# Patient Record
Sex: Female | Born: 1955
Health system: Southern US, Community
[De-identification: ages and names within clinical notes are randomized; demographics above are authoritative.]

## PROBLEM LIST (undated history)

## (undated) DIAGNOSIS — K8020 Calculus of gallbladder without cholecystitis without obstruction: Secondary | ICD-10-CM

## (undated) DIAGNOSIS — Z86010 Personal history of colonic polyps: Secondary | ICD-10-CM

## (undated) DIAGNOSIS — J449 Chronic obstructive pulmonary disease, unspecified: Secondary | ICD-10-CM

## (undated) DIAGNOSIS — J189 Pneumonia, unspecified organism: Secondary | ICD-10-CM

## (undated) DIAGNOSIS — K7030 Alcoholic cirrhosis of liver without ascites: Secondary | ICD-10-CM

## (undated) DIAGNOSIS — J439 Emphysema, unspecified: Secondary | ICD-10-CM

## (undated) DIAGNOSIS — M1990 Unspecified osteoarthritis, unspecified site: Secondary | ICD-10-CM

## (undated) DIAGNOSIS — K219 Gastro-esophageal reflux disease without esophagitis: Secondary | ICD-10-CM

## (undated) DIAGNOSIS — K7682 Hepatic encephalopathy: Secondary | ICD-10-CM

## (undated) HISTORY — DX: Unspecified osteoarthritis, unspecified site: M19.90

## (undated) HISTORY — DX: Emphysema, unspecified: J43.9

## (undated) HISTORY — PX: CHOLECYSTECTOMY: SHX55

## (undated) HISTORY — DX: Calculus of gallbladder without cholecystitis without obstruction: K80.20

## (undated) HISTORY — DX: Chronic obstructive pulmonary disease, unspecified: J44.9

## (undated) HISTORY — DX: Pneumonia, unspecified organism: J18.9

## (undated) HISTORY — PX: TONSILLECTOMY: SUR1361

## (undated) HISTORY — DX: Gastro-esophageal reflux disease without esophagitis: K21.9

## (undated) HISTORY — DX: Personal history of colonic polyps: Z86.010

---

## 2002-11-15 ENCOUNTER — Emergency Department (HOSPITAL_COMMUNITY): Admission: AD | Admit: 2002-11-15 | Discharge: 2002-11-15 | Payer: Self-pay | Admitting: Family Medicine

## 2009-02-28 ENCOUNTER — Emergency Department (HOSPITAL_COMMUNITY): Admission: EM | Admit: 2009-02-28 | Discharge: 2009-02-28 | Payer: Self-pay | Admitting: Family Medicine

## 2009-08-22 ENCOUNTER — Emergency Department (HOSPITAL_COMMUNITY): Admission: EM | Admit: 2009-08-22 | Discharge: 2009-08-22 | Payer: Self-pay | Admitting: Emergency Medicine

## 2009-09-09 ENCOUNTER — Emergency Department (HOSPITAL_COMMUNITY): Admission: EM | Admit: 2009-09-09 | Discharge: 2009-09-09 | Payer: Self-pay | Admitting: Emergency Medicine

## 2009-10-05 ENCOUNTER — Ambulatory Visit (HOSPITAL_COMMUNITY): Admission: RE | Admit: 2009-10-05 | Discharge: 2009-10-05 | Payer: Self-pay | Admitting: Surgery

## 2009-12-31 ENCOUNTER — Emergency Department (HOSPITAL_COMMUNITY)
Admission: EM | Admit: 2009-12-31 | Discharge: 2009-12-31 | Payer: Self-pay | Source: Home / Self Care | Admitting: Emergency Medicine

## 2010-03-26 LAB — URINE CULTURE
Colony Count: 25000
Culture  Setup Time: 201112181950

## 2010-03-26 LAB — COMPREHENSIVE METABOLIC PANEL
ALT: 26 U/L (ref 0–35)
AST: 27 U/L (ref 0–37)
Albumin: 3.8 g/dL (ref 3.5–5.2)
Alkaline Phosphatase: 73 U/L (ref 39–117)
BUN: 8 mg/dL (ref 6–23)
CO2: 27 mEq/L (ref 19–32)
Calcium: 9.6 mg/dL (ref 8.4–10.5)
Chloride: 95 mEq/L — ABNORMAL LOW (ref 96–112)
Creatinine, Ser: 0.67 mg/dL (ref 0.4–1.2)
GFR calc Af Amer: >60 mL/min (ref >60)
GFR calc non Af Amer: >60 mL/min (ref >60)
Glucose, Bld: 133 mg/dL — ABNORMAL HIGH (ref 70–99)
Potassium: 3.1 mEq/L — ABNORMAL LOW (ref 3.5–5.1)
Sodium: 131 mEq/L — ABNORMAL LOW (ref 135–145)
Total Bilirubin: 0.7 mg/dL (ref 0.3–1.2)
Total Protein: 7 g/dL (ref 6.0–8.3)

## 2010-03-26 LAB — URINALYSIS, ROUTINE W REFLEX MICROSCOPIC
Glucose, UA: NEGATIVE mg/dL
Ketones, ur: 15 mg/dL — AB
Nitrite: NEGATIVE
Protein, ur: NEGATIVE mg/dL
Specific Gravity, Urine: 1.028 (ref 1.005–1.030)
Urobilinogen, UA: 1 mg/dL (ref 0.0–1.0)
pH: 6 (ref 5.0–8.0)

## 2010-03-26 LAB — URINE MICROSCOPIC-ADD ON

## 2010-03-26 LAB — CBC
HCT: 45.5 % (ref 36.0–46.0)
Hemoglobin: 15.9 g/dL — ABNORMAL HIGH (ref 12.0–15.0)
MCH: 31.9 pg (ref 26.0–34.0)
MCHC: 34.9 g/dL (ref 30.0–36.0)
MCV: 91.2 fL (ref 78.0–100.0)
Platelets: 294 10*3/uL (ref 150–400)
RBC: 4.99 MIL/uL (ref 3.87–5.11)
RDW: 13.1 % (ref 11.5–15.5)
WBC: 6.9 10*3/uL (ref 4.0–10.5)

## 2010-03-26 LAB — LIPASE, BLOOD: Lipase: 22 U/L (ref 11–59)

## 2010-03-26 LAB — DIFFERENTIAL
Basophils Absolute: 0 10*3/uL (ref 0.0–0.1)
Basophils Relative: 0 % (ref 0–1)
Eosinophils Absolute: 0 10*3/uL (ref 0.0–0.7)
Eosinophils Relative: 0 % (ref 0–5)
Lymphocytes Relative: 15 % (ref 12–46)
Lymphs Abs: 1 10*3/uL (ref 0.7–4.0)
Monocytes Absolute: 0.9 10*3/uL (ref 0.1–1.0)
Monocytes Relative: 13 % — ABNORMAL HIGH (ref 3–12)
Neutro Abs: 5 10*3/uL (ref 1.7–7.7)
Neutrophils Relative %: 72 % (ref 43–77)

## 2010-03-29 LAB — CBC
HCT: 46 % (ref 36.0–46.0)
Hemoglobin: 15.6 g/dL — ABNORMAL HIGH (ref 12.0–15.0)
MCH: 33 pg (ref 26.0–34.0)
MCHC: 33.9 g/dL (ref 30.0–36.0)
MCV: 97.3 fL (ref 78.0–100.0)
Platelets: 313 10*3/uL (ref 150–400)
RBC: 4.73 MIL/uL (ref 3.87–5.11)
RDW: 13.1 % (ref 11.5–15.5)
WBC: 5.5 10*3/uL (ref 4.0–10.5)

## 2010-03-29 LAB — COMPREHENSIVE METABOLIC PANEL
ALT: 51 U/L — ABNORMAL HIGH (ref 0–35)
AST: 74 U/L — ABNORMAL HIGH (ref 0–37)
Albumin: 3.9 g/dL (ref 3.5–5.2)
Alkaline Phosphatase: 87 U/L (ref 39–117)
BUN: 5 mg/dL — ABNORMAL LOW (ref 6–23)
CO2: 24 mEq/L (ref 19–32)
Calcium: 9.9 mg/dL (ref 8.4–10.5)
Chloride: 101 mEq/L (ref 96–112)
Creatinine, Ser: 0.84 mg/dL (ref 0.4–1.2)
GFR calc Af Amer: >60 mL/min (ref >60)
GFR calc non Af Amer: >60 mL/min (ref >60)
Glucose, Bld: 74 mg/dL (ref 70–99)
Potassium: 4.4 mEq/L (ref 3.5–5.1)
Sodium: 137 mEq/L (ref 135–145)
Total Bilirubin: 1.3 mg/dL — ABNORMAL HIGH (ref 0.3–1.2)
Total Protein: 7.1 g/dL (ref 6.0–8.3)

## 2010-03-29 LAB — DIFFERENTIAL
Basophils Absolute: 0 10*3/uL (ref 0.0–0.1)
Basophils Relative: 1 % (ref 0–1)
Eosinophils Absolute: 0.2 10*3/uL (ref 0.0–0.7)
Eosinophils Relative: 3 % (ref 0–5)
Lymphocytes Relative: 16 % (ref 12–46)
Lymphs Abs: 0.9 10*3/uL (ref 0.7–4.0)
Monocytes Absolute: 0.7 10*3/uL (ref 0.1–1.0)
Monocytes Relative: 13 % — ABNORMAL HIGH (ref 3–12)
Neutro Abs: 3.8 10*3/uL (ref 1.7–7.7)
Neutrophils Relative %: 68 % (ref 43–77)

## 2010-03-29 LAB — SURGICAL PCR SCREEN
MRSA, PCR: NEGATIVE
Staphylococcus aureus: NEGATIVE

## 2010-03-30 LAB — DIFFERENTIAL
Basophils Absolute: 0 10*3/uL (ref 0.0–0.1)
Basophils Absolute: 0.1 10*3/uL (ref 0.0–0.1)
Basophils Relative: 1 % (ref 0–1)
Basophils Relative: 1 % (ref 0–1)
Eosinophils Absolute: 0.2 10*3/uL (ref 0.0–0.7)
Eosinophils Absolute: 0.1 10*3/uL (ref 0.0–0.7)
Eosinophils Relative: 3 % (ref 0–5)
Eosinophils Relative: 3 % (ref 0–5)
Lymphocytes Relative: 14 % (ref 12–46)
Lymphocytes Relative: 14 % (ref 12–46)
Lymphs Abs: 0.8 10*3/uL (ref 0.7–4.0)
Monocytes Absolute: 0.6 10*3/uL (ref 0.1–1.0)
Monocytes Relative: 9 % (ref 3–12)
Neutro Abs: 4.6 10*3/uL (ref 1.7–7.7)
Neutrophils Relative %: 74 % (ref 43–77)

## 2010-03-30 LAB — URINE CULTURE
Colony Count: NO GROWTH
Culture  Setup Time: 201108092225
Culture: NO GROWTH

## 2010-03-30 LAB — URINALYSIS, ROUTINE W REFLEX MICROSCOPIC
Bilirubin Urine: NEGATIVE
Bilirubin Urine: NEGATIVE
Glucose, UA: NEGATIVE mg/dL
Glucose, UA: NEGATIVE mg/dL
Hgb urine dipstick: NEGATIVE
Hgb urine dipstick: NEGATIVE
Ketones, ur: 15 mg/dL — AB
Ketones, ur: NEGATIVE mg/dL
Nitrite: NEGATIVE
Nitrite: NEGATIVE
Protein, ur: NEGATIVE mg/dL
Protein, ur: NEGATIVE mg/dL
Specific Gravity, Urine: 1.013 (ref 1.005–1.030)
Specific Gravity, Urine: 1.015 (ref 1.005–1.030)
Urobilinogen, UA: 0.2 mg/dL (ref 0.0–1.0)
Urobilinogen, UA: 0.2 mg/dL (ref 0.0–1.0)
pH: 6 (ref 5.0–8.0)
pH: 6 (ref 5.0–8.0)

## 2010-03-30 LAB — CBC
HCT: 40.7 % (ref 36.0–46.0)
HCT: 41.1 % (ref 36.0–46.0)
Hemoglobin: 14 g/dL (ref 12.0–15.0)
Hemoglobin: 14 g/dL (ref 12.0–15.0)
MCH: 32.9 pg (ref 26.0–34.0)
MCH: 32.5 pg (ref 26.0–34.0)
MCHC: 34.4 g/dL (ref 30.0–36.0)
MCHC: 34.1 g/dL (ref 30.0–36.0)
MCV: 95.8 fL (ref 78.0–100.0)
MCV: 95.4 fL (ref 78.0–100.0)
Platelets: 374 10*3/uL (ref 150–400)
Platelets: 363 10*3/uL (ref 150–400)
RBC: 4.25 MIL/uL (ref 3.87–5.11)
RBC: 4.31 MIL/uL (ref 3.87–5.11)
RDW: 13.4 % (ref 11.5–15.5)
RDW: 13.2 % (ref 11.5–15.5)
WBC: 6.2 10*3/uL (ref 4.0–10.5)
WBC: 5.3 10*3/uL (ref 4.0–10.5)

## 2010-03-30 LAB — POCT I-STAT, CHEM 8
HCT: 45 % (ref 36.0–46.0)
Hemoglobin: 15.3 g/dL — ABNORMAL HIGH (ref 12.0–15.0)
Potassium: 4 mEq/L (ref 3.5–5.1)
Sodium: 134 mEq/L — ABNORMAL LOW (ref 135–145)
TCO2: 23 mmol/L (ref 0–100)

## 2010-03-30 LAB — COMPREHENSIVE METABOLIC PANEL
ALT: 21 U/L (ref 0–35)
AST: 32 U/L (ref 0–37)
Albumin: 3.5 g/dL (ref 3.5–5.2)
Alkaline Phosphatase: 71 U/L (ref 39–117)
BUN: 2 mg/dL — ABNORMAL LOW (ref 6–23)
CO2: 23 mEq/L (ref 19–32)
Calcium: 9.1 mg/dL (ref 8.4–10.5)
Chloride: 105 mEq/L (ref 96–112)
Creatinine, Ser: 0.64 mg/dL (ref 0.4–1.2)
GFR calc Af Amer: >60 mL/min (ref >60)
GFR calc non Af Amer: >60 mL/min (ref >60)
Glucose, Bld: 103 mg/dL — ABNORMAL HIGH (ref 70–99)
Potassium: 3.6 mEq/L (ref 3.5–5.1)
Sodium: 135 mEq/L (ref 135–145)
Total Bilirubin: 0.1 mg/dL — ABNORMAL LOW (ref 0.3–1.2)
Total Protein: 6.7 g/dL (ref 6.0–8.3)

## 2010-03-30 LAB — HEPATIC FUNCTION PANEL
ALT: 27 U/L (ref 0–35)
Indirect Bilirubin: 0.2 mg/dL — ABNORMAL LOW (ref 0.3–0.9)
Total Protein: 6.7 g/dL (ref 6.0–8.3)

## 2010-03-30 LAB — URINE MICROSCOPIC-ADD ON

## 2010-03-30 LAB — D-DIMER, QUANTITATIVE: D-Dimer, Quant: 0.72 ug/mL-FEU — ABNORMAL HIGH (ref 0.00–0.48)

## 2010-03-30 LAB — POCT CARDIAC MARKERS
CKMB, poc: <1 ng/mL — ABNORMAL LOW (ref 1.0–8.0)
Myoglobin, poc: 36.4 ng/mL (ref 12–200)

## 2010-03-30 LAB — WET PREP, GENITAL: Clue Cells Wet Prep HPF POC: NONE SEEN

## 2010-03-30 LAB — LIPASE, BLOOD
Lipase: 22 U/L (ref 11–59)
Lipase: 24 U/L (ref 11–59)

## 2010-06-06 ENCOUNTER — Emergency Department (HOSPITAL_COMMUNITY): Payer: Self-pay

## 2010-06-06 ENCOUNTER — Emergency Department (HOSPITAL_COMMUNITY)
Admission: EM | Admit: 2010-06-06 | Discharge: 2010-06-07 | Disposition: A | Discharge status: Home / Self Care | Payer: Self-pay | Attending: Emergency Medicine | Admitting: Emergency Medicine

## 2010-06-06 DIAGNOSIS — M549 Dorsalgia, unspecified: Secondary | ICD-10-CM | POA: Insufficient documentation

## 2010-06-06 DIAGNOSIS — R1011 Right upper quadrant pain: Secondary | ICD-10-CM | POA: Insufficient documentation

## 2010-06-06 DIAGNOSIS — R112 Nausea with vomiting, unspecified: Secondary | ICD-10-CM | POA: Insufficient documentation

## 2010-06-06 DIAGNOSIS — G8929 Other chronic pain: Secondary | ICD-10-CM | POA: Insufficient documentation

## 2010-06-06 LAB — CBC
Hemoglobin: 14.2 g/dL (ref 12.0–15.0)
MCH: 31.5 pg (ref 26.0–34.0)
MCHC: 34.9 g/dL (ref 30.0–36.0)
Platelets: 315 10*3/uL (ref 150–400)

## 2010-06-06 LAB — DIFFERENTIAL
Basophils Absolute: 0 10*3/uL (ref 0.0–0.1)
Basophils Relative: 0 % (ref 0–1)
Eosinophils Absolute: 0.1 10*3/uL (ref 0.0–0.7)
Monocytes Absolute: 0.6 10*3/uL (ref 0.1–1.0)
Monocytes Relative: 8 % (ref 3–12)
Neutro Abs: 5.8 10*3/uL (ref 1.7–7.7)
Neutrophils Relative %: 78 % — ABNORMAL HIGH (ref 43–77)

## 2010-06-06 LAB — BASIC METABOLIC PANEL
CO2: 26 mEq/L (ref 19–32)
Calcium: 9.8 mg/dL (ref 8.4–10.5)
Creatinine, Ser: 0.5 mg/dL (ref 0.4–1.2)
GFR calc Af Amer: >60 mL/min (ref >60)
GFR calc non Af Amer: >60 mL/min (ref >60)
Glucose, Bld: 93 mg/dL (ref 70–99)
Sodium: 129 mEq/L — ABNORMAL LOW (ref 135–145)

## 2010-06-06 LAB — URINALYSIS, ROUTINE W REFLEX MICROSCOPIC
Nitrite: NEGATIVE
Protein, ur: NEGATIVE mg/dL
Specific Gravity, Urine: 1.021 (ref 1.005–1.030)
Urobilinogen, UA: 0.2 mg/dL (ref 0.0–1.0)

## 2010-06-07 MED ORDER — IOHEXOL 300 MG/ML  SOLN
100.0000 mL | Freq: Once | INTRAMUSCULAR | Status: AC | PRN
  Administered 2010-06-07: 100 mL via INTRAVENOUS

## 2010-06-14 ENCOUNTER — Emergency Department (HOSPITAL_COMMUNITY)
Admission: EM | Admit: 2010-06-14 | Discharge: 2010-06-14 | Disposition: A | Discharge status: Home / Self Care | Payer: Self-pay | Attending: Emergency Medicine | Admitting: Emergency Medicine

## 2010-06-14 ENCOUNTER — Emergency Department (HOSPITAL_COMMUNITY): Payer: Self-pay

## 2010-06-14 DIAGNOSIS — Z760 Encounter for issue of repeat prescription: Secondary | ICD-10-CM | POA: Insufficient documentation

## 2010-06-14 DIAGNOSIS — R112 Nausea with vomiting, unspecified: Secondary | ICD-10-CM | POA: Insufficient documentation

## 2010-06-14 DIAGNOSIS — R509 Fever, unspecified: Secondary | ICD-10-CM | POA: Insufficient documentation

## 2010-06-14 DIAGNOSIS — R109 Unspecified abdominal pain: Secondary | ICD-10-CM | POA: Insufficient documentation

## 2010-06-14 DIAGNOSIS — G8929 Other chronic pain: Secondary | ICD-10-CM | POA: Insufficient documentation

## 2010-06-14 DIAGNOSIS — M549 Dorsalgia, unspecified: Secondary | ICD-10-CM | POA: Insufficient documentation

## 2010-06-14 LAB — URINE MICROSCOPIC-ADD ON

## 2010-06-14 LAB — COMPREHENSIVE METABOLIC PANEL
AST: 29 U/L (ref 0–37)
Albumin: 3.9 g/dL (ref 3.5–5.2)
Alkaline Phosphatase: 84 U/L (ref 39–117)
BUN: 3 mg/dL — ABNORMAL LOW (ref 6–23)
Chloride: 92 mEq/L — ABNORMAL LOW (ref 96–112)
GFR calc Af Amer: >60 mL/min (ref >60)
Potassium: 3.5 mEq/L (ref 3.5–5.1)
Sodium: 127 mEq/L — ABNORMAL LOW (ref 135–145)
Total Bilirubin: 0.2 mg/dL — ABNORMAL LOW (ref 0.3–1.2)
Total Protein: 7.1 g/dL (ref 6.0–8.3)

## 2010-06-14 LAB — CBC
Hemoglobin: 14 g/dL (ref 12.0–15.0)
MCH: 32.1 pg (ref 26.0–34.0)
Platelets: 327 10*3/uL (ref 150–400)
RBC: 4.36 MIL/uL (ref 3.87–5.11)
WBC: 6.7 10*3/uL (ref 4.0–10.5)

## 2010-06-14 LAB — URINALYSIS, ROUTINE W REFLEX MICROSCOPIC
Glucose, UA: NEGATIVE mg/dL
Ketones, ur: 15 mg/dL — AB
Leukocytes, UA: NEGATIVE
pH: 6 (ref 5.0–8.0)

## 2010-06-14 LAB — DIFFERENTIAL
Basophils Absolute: 0.1 10*3/uL (ref 0.0–0.1)
Basophils Relative: 1 % (ref 0–1)
Eosinophils Absolute: 0.1 10*3/uL (ref 0.0–0.7)
Neutro Abs: 5 10*3/uL (ref 1.7–7.7)
Neutrophils Relative %: 75 % (ref 43–77)

## 2010-06-14 LAB — POCT PREGNANCY, URINE: Preg Test, Ur: NEGATIVE

## 2013-01-14 HISTORY — PX: ESOPHAGOGASTRODUODENOSCOPY: SHX1529

## 2013-01-14 HISTORY — PX: COLONOSCOPY: SHX174

## 2013-02-19 ENCOUNTER — Ambulatory Visit (INDEPENDENT_AMBULATORY_CARE_PROVIDER_SITE_OTHER): Payer: BC Managed Care – PPO | Admitting: Physician Assistant

## 2013-02-19 ENCOUNTER — Encounter: Payer: Self-pay | Admitting: Physician Assistant

## 2013-02-19 ENCOUNTER — Telehealth: Payer: Self-pay | Admitting: Physician Assistant

## 2013-02-19 VITALS — BP 142/78 | HR 66 | Ht 66.0 in | Wt 136.0 lb

## 2013-02-19 DIAGNOSIS — R1013 Epigastric pain: Secondary | ICD-10-CM

## 2013-02-19 DIAGNOSIS — R1011 Right upper quadrant pain: Secondary | ICD-10-CM

## 2013-02-19 DIAGNOSIS — J432 Centrilobular emphysema: Secondary | ICD-10-CM | POA: Insufficient documentation

## 2013-02-19 DIAGNOSIS — J441 Chronic obstructive pulmonary disease with (acute) exacerbation: Secondary | ICD-10-CM

## 2013-02-19 DIAGNOSIS — R112 Nausea with vomiting, unspecified: Secondary | ICD-10-CM

## 2013-02-19 DIAGNOSIS — Z9049 Acquired absence of other specified parts of digestive tract: Secondary | ICD-10-CM | POA: Insufficient documentation

## 2013-02-19 DIAGNOSIS — Z9089 Acquired absence of other organs: Secondary | ICD-10-CM

## 2013-02-19 DIAGNOSIS — Z800 Family history of malignant neoplasm of digestive organs: Secondary | ICD-10-CM

## 2013-02-19 MED ORDER — OXYCODONE-ACETAMINOPHEN 10-325 MG PO TABS: 1.0000 | ORAL_TABLET | ORAL | Status: DC | PRN

## 2013-02-19 MED ORDER — MOVIPREP 100 G PO SOLR: 1.0000 | ORAL | Status: DC

## 2013-02-19 MED ORDER — PANTOPRAZOLE SODIUM 40 MG PO TBEC: 40.0000 mg | DELAYED_RELEASE_TABLET | Freq: Two times a day (BID) | ORAL | Status: DC

## 2013-02-19 NOTE — Patient Instructions (Signed)
We sent prescriptions to [facility]. 1. Protonix 40 mg. 2. Moviprep- colonoscopy prep 3. We gave you a printed prescription for Percocet for pain to take to your pharmacy.  You have been scheduled for an endoscopy and colonoscopy with propofol. Please follow the written instructions given to you at your visit today. Please pick up your prep at the pharmacy within the next 1-3 days. If you use inhalers (even only as needed), please bring them with you on the day of your procedure. Your physician has requested that you go to www.startemmi.com and enter the access code given to you at your visit today. This web site gives a general overview about your procedure. However, you should still follow specific instructions given to you by our office regarding your preparation for the procedure.

## 2013-02-19 NOTE — Progress Notes (Signed)
Agree with Ms. Esterwood's assessment and plan. Faith Patricelli E. Emanuell Morina, MD, FACG   

## 2013-02-19 NOTE — Telephone Encounter (Signed)
Spoke to Youth worker.  Advised him the patient is to take 1 tablet every 4-6 hours as needed for severe pain.

## 2013-02-19 NOTE — Progress Notes (Signed)
Subjective:    Patient ID: Elizabeth Cox, female    DOB: February 22, 1955, 58 y.o.   MRN: [id number]  HPI  Aurielle is a pleasant 58 year old white female referred today by her PCP Heide Scales in[place] at [facility] for evaluation of epigastric pain nausea and vomiting. Patient is new to GI. She is status post cholecystectomy in 2011 and at that time was noted to have a fatty liver on ultrasound. Patient says she has been having pain over the past 3 weeks and has had a couple trips to the emergency room. Most recent recent records show ER visit on 02/11/2013 normal LFTs at that time and CBC unremarkable with the exception of a hemoglobin of 17. Lipase 12 CT scan of the abdomen and pelvis was done with contrast showed no acute findings liver, spleen ,pancreas ,adrenal glands are unremarkable no pelvic abnormality or bowel abnormality noted. Patient states her worst week was last week she stayed home from work for 3 days and had ongoing epigastric and right upper quadrant pain that radiated somewhat around into her back. This was associated with nausea and vomiting. About a week and a half prior to that she had had a couple of days' worth of upper abdominal pain nausea and vomiting as well. After the ER visit she started on Protonix 40 mg by mouth daily. She also states she was taking Goody powders at least 2 per day over the past couple of years and has stopped those over the past week and a half. He was not on any other NSAIDs. She says this week she has not felt as bad but still has upper abdominal pain. She has had no nausea or vomiting and is able to keep better. No documented fever or chills no diarrhea melena or hematochezia. He says when her pain was bad it felt like her gallbladder pain. She says she is heard so bad that she is afraid of recurrent pain.  On questioning she also admits that her mother had colon cancer diagnosed at age 56 and she has not yet had a colonoscopy.     Review of  Systems  Constitutional: Positive for appetite change.  HENT: Negative.   Eyes: Negative.   Respiratory: Negative.   Cardiovascular: Negative.   Gastrointestinal: Positive for nausea, vomiting and abdominal pain.  Endocrine: Negative.   Genitourinary: Negative.   Musculoskeletal: Negative.   Allergic/Immunologic: Negative.   Neurological: Negative.   Hematological: Negative.   Psychiatric/Behavioral: Negative.    Outpatient Encounter Prescriptions as of 02/19/2013  Medication Sig  . oxyCODONE-acetaminophen (PERCOCET) 10-325 MG per tablet Take 1 tablet by mouth as needed for pain.  . pantoprazole (PROTONIX) 40 MG tablet Take 1 tablet (40 mg total) by mouth 2 (two) times daily.  . [DISCONTINUED] oxyCODONE-acetaminophen (PERCOCET) 10-325 MG per tablet Take 1 tablet by mouth as needed for pain.  . [DISCONTINUED] pantoprazole (PROTONIX) 40 MG tablet Take 40 mg by mouth daily.  Marland Kitchen MOVIPREP 100 G SOLR Take 1 kit (200 g total) by mouth as directed.       Allergies  Allergen Reactions  . Vicodin [Hydrocodone-Acetaminophen] Nausea And Vomiting   Patient Active Problem List   Diagnosis Date Noted  . COPD exacerbation 02/19/2013  . FH: colon cancer 02/19/2013  . S/P cholecystectomy 02/19/2013   History  Substance Use Topics  . Smoking status: Current Every Day Smoker -- 1.00 packs/day    Types: Cigarettes  . Smokeless tobacco: Never Used  . Alcohol Use:  No   family history includes Colon cancer (age of onset: 58) in her mother; Diabetes in her mother; Heart disease in her mother.   Objective:   Physical Exam female in no acute distress, pleasant blood pressure 142/78 pulse 66 height 5 foot 6 weight 136. HEENT; nontraumatic normocephalic EOMI PERRLA sclera anicteric, Supple; no JVD, Cardiovascular; regular rate and rhythm with S1-S2 no murmur or gallop, Pulmonary, decreased breath sounds bilaterally, Abdomen ;soft bowel sounds are present mildly tender in the epigastrium and right upper  quadrant, no guarding or rebound no palpable mass or hepatosplenomegaly, Rectal; exam not done, Shoney's no clubbing cyanosis or edema skin warm and dry, Psych; mood and affect appropriate        Assessment & Plan:  #75  58 year old female status post cholecystectomy 4 years ago now with to and half week history of epigastric pain radiating to the right upper quadrant and associated with nausea and vomiting. Initial workup with CT and labs unrevealing. This Somewhat improved this week after starting a PPI and stopping Goody powders raising suspicion for peptic ulcer disease  #2 positive family history of colon cancer patient's mother-patient has not had screening colonoscopy #3 smoker #4 COPD  Plan;Increase Protonix to 40 mg by mouth twice daily x1 month Patient asked to avoid all aspirin and NSAIDs Schedule for upper endoscopy with Dr. Harold Barban discussed in detail the patient is critical to proceed. We also discussed colonoscopy which she would like to do at the same time and will schedule for colonoscopy as well. Refill Percocet 10/325 at patient's request #20 no refills this is to be used when necessary only for recurrent severe pain Soft bland diet

## 2013-02-22 ENCOUNTER — Encounter: Payer: Self-pay | Admitting: Internal Medicine

## 2013-03-03 ENCOUNTER — Telehealth: Payer: Self-pay | Admitting: Internal Medicine

## 2013-03-03 NOTE — Telephone Encounter (Signed)
Calling back with phone number for Prior Auth [phone]

## 2013-03-03 NOTE — Telephone Encounter (Signed)
Patient is asking to switch her Protonix to daily because her insurance will not pay for BID. Is this ok?

## 2013-03-04 NOTE — Telephone Encounter (Signed)
Called patient and she would like to have the prior auth for medication BID since she is feeling better. Called prior auth number and received PA for Protonix 40 mg BID for lifetime .Patient notified.

## 2013-03-04 NOTE — Telephone Encounter (Signed)
Left a message for patient to call me. 

## 2013-03-04 NOTE — Telephone Encounter (Signed)
Sure, that's fine.

## 2013-03-08 ENCOUNTER — Other Ambulatory Visit: Payer: Self-pay | Admitting: *Deleted

## 2013-03-08 ENCOUNTER — Telehealth: Payer: Self-pay | Admitting: Physician Assistant

## 2013-03-08 MED ORDER — OXYCODONE-ACETAMINOPHEN 10-325 MG PO TABS: 1.0000 | ORAL_TABLET | ORAL | Status: DC | PRN

## 2013-03-08 NOTE — Telephone Encounter (Signed)
I called and left a message at 4:43 PM today 03-08-2013 that [place] PA-C  Approved # 15 tablets of the percocet.  I put the signed prescription at the front desk in the envelope file.  I advised her of the time when I called, 4:43 PM and we are open at 8:00 am tomorrow 03-09-2012.

## 2013-03-17 ENCOUNTER — Encounter: Payer: Self-pay | Admitting: Internal Medicine

## 2013-03-17 ENCOUNTER — Ambulatory Visit (AMBULATORY_SURGERY_CENTER): Payer: BC Managed Care – PPO | Admitting: Internal Medicine

## 2013-03-17 VITALS — BP 143/79 | HR 81 | Temp 98.9°F | Resp 26 | Ht 66.0 in | Wt 136.0 lb

## 2013-03-17 DIAGNOSIS — Z860100 Personal history of colon polyps, unspecified: Secondary | ICD-10-CM

## 2013-03-17 DIAGNOSIS — K2990 Gastroduodenitis, unspecified, without bleeding: Secondary | ICD-10-CM

## 2013-03-17 DIAGNOSIS — K2970 Gastritis, unspecified, without bleeding: Secondary | ICD-10-CM

## 2013-03-17 DIAGNOSIS — D126 Benign neoplasm of colon, unspecified: Secondary | ICD-10-CM

## 2013-03-17 DIAGNOSIS — R1013 Epigastric pain: Secondary | ICD-10-CM

## 2013-03-17 DIAGNOSIS — Z1211 Encounter for screening for malignant neoplasm of colon: Secondary | ICD-10-CM

## 2013-03-17 HISTORY — DX: Personal history of colon polyps, unspecified: Z86.0100

## 2013-03-17 HISTORY — DX: Personal history of colonic polyps: Z86.010

## 2013-03-17 MED ORDER — OXYCODONE-ACETAMINOPHEN 10-325 MG PO TABS: 1.0000 | ORAL_TABLET | ORAL | Status: DC | PRN

## 2013-03-17 MED ORDER — GLYCOPYRROLATE 2 MG PO TABS: 2.0000 mg | ORAL_TABLET | Freq: Two times a day (BID) | ORAL | Status: DC

## 2013-03-17 MED ORDER — SODIUM CHLORIDE 0.9 % IV SOLN: 500.0000 mL | INTRAVENOUS | Status: DC

## 2013-03-17 MED ORDER — PANTOPRAZOLE SODIUM 40 MG PO TBEC: 40.0000 mg | DELAYED_RELEASE_TABLET | Freq: Two times a day (BID) | ORAL | Status: DC

## 2013-03-17 NOTE — Progress Notes (Signed)
  [facility] Post-op Note  Patient: Elizabeth Cox  Procedure(s) Performed: colonoscopy and endoscopy  Patient Location: [facility] - Recovery Area  Anesthesia Type: Deep Sedation/Propofol  Level of Consciousness: awake, oriented and patient cooperative  Airway and Oxygen Therapy: Patient Spontanous Breathing  Post-op Pain: none  Post-op Assessment:  Post-op Vital signs reviewed, Patient's Cardiovascular Status Stable, Respiratory Function Stable, Patent Airway, No signs of Nausea or vomiting and Pain level controlled  Post-op Vital Signs: Reviewed and stable  Complications: No apparent anesthesia complications  Annlee Glandon E 4:01 PM

## 2013-03-17 NOTE — Op Note (Signed)
[place]  [address] [place], [address]   COLONOSCOPY PROCEDURE REPORT  PATIENT: Elizabeth, Cox  MR#: [id number] BIRTHDATE: [birth year] , 36  yrs. old GENDER: Female ENDOSCOPIST: Gatha Mayer, MD, FACG REFERRED SW:FUXNAT York , NP PROCEDURE DATE:  03/17/2013 PROCEDURE:   Colonoscopy with snare polypectomy First Screening Colonoscopy - Avg.  risk and is 50 yrs.  old or older Yes.  Prior Negative Screening - Now for repeat screening. N/A  History of Adenoma - Now for follow-up colonoscopy & has been > or = to 3 yrs.  N/A  Polyps Removed Today? Yes. ASA CLASS:   Class II INDICATIONS:average risk screening and first colonoscopy. MEDICATIONS: propofol (Diprivan) 200mg  IV, MAC sedation, administered by CRNA, and These medications were titrated to patient response per physician's verbal order  DESCRIPTION OF PROCEDURE:   After the risks benefits and alternatives of the procedure were thoroughly explained, informed consent was obtained.  A digital rectal exam revealed no abnormalities of the rectum.   The LB [id number]  endoscope was introduced through the anus and advanced to the cecum, which was identified by both the appendix and ileocecal valve. No adverse events experienced.   The quality of the prep was Suprep good  The instrument was then slowly withdrawn as the colon was fully examined.      COLON FINDINGS: Seven sessile polyps measuring 3-10 mm in size were found in the descending colon (4 - 2 hot and 2 cold snare) and sigmoid colon (3 cold snare).  A polypectomy was performed with a cold snare and using snare cautery.  The resection was complete and the polyp tissue was completely retrieved.   The colon mucosa was otherwise normal.  Retroflexed views revealed no abnormalities. The time to cecum=3 minutes 51 seconds.  Withdrawal time=14 minutes 54 seconds.  The scope was withdrawn and the procedure completed. COMPLICATIONS: There were no  complications.  ENDOSCOPIC IMPRESSION: 1.   Seven sessile polyps measuring 3-10 mm in size were found in the descending colon and sigmoid colon; polypectomy was performed with a cold snare and using snare cautery 2.   The colon mucosa was otherwise normal - good prep  RECOMMENDATIONS: 1.  Hold aspirin, aspirin products, and anti-inflammatory medication for 2 weeks. 2.  Timing of repeat colonoscopy will be determined by pathology findings.   eSigned:  Gatha Mayer, MD, [facility] 03/17/2013 4:13 PM   cc: The Patient and Heide Scales, MD   PATIENT NAME:  Elizabeth, Cox MR#: [id number]

## 2013-03-17 NOTE — Progress Notes (Signed)
Called to room to assist during endoscopic procedure.  Patient ID and intended procedure confirmed with present staff. Received instructions for my participation in the procedure from the performing physician.  

## 2013-03-17 NOTE — Op Note (Signed)
[place]  [address]. [place], [address]   ENDOSCOPY PROCEDURE REPORT  PATIENT: Elizabeth Cox, Elizabeth Cox  MR#: [id number] BIRTHDATE: [birth year] , 75  yrs. old GENDER: Female ENDOSCOPIST: Gatha Mayer, MD, [facility] REFERRED BY:  Heide Scales , NP PROCEDURE DATE:  03/17/2013 PROCEDURE:  EGD w/ biopsy ASA CLASS:     Class II INDICATIONS:  Epigastric pain. MEDICATIONS: propofol (Diprivan) 200mg  IV, MAC sedation, administered by CRNA, and These medications were titrated to patient response per physician's verbal order TOPICAL ANESTHETIC: none  DESCRIPTION OF PROCEDURE: After the risks benefits and alternatives of the procedure were thoroughly explained, informed consent was obtained.  The LB [id number] endoscope was introduced through the mouth and advanced to the second portion of the duodenum. Without limitations.  The instrument was slowly withdrawn as the mucosa was fully examined.        STOMACH: Moderate gastritis (inflammation) was found in the gastric antrum.  Multiple biopsies were performed using cold forceps. Sample sent for histology.  The remainder of the upper endoscopy exam was otherwise normal. Retroflexed views revealed no abnormalities.     The scope was then withdrawn from the patient and the procedure completed.  COMPLICATIONS: There were no complications. ENDOSCOPIC IMPRESSION: 1.   Gastritis (inflammation) was found in the gastric antrum; multiple biopsies 2.   The remainder of the upper endoscopy exam was otherwise normal  RECOMMENDATIONS: 1.  Continue PPI and add glycopyrrolate, refill Percocet 1 more time only, stay off Goody's 2.  Office will call with results/plans 3.  Proceed with colonoscopy    eSigned:  Gatha Mayer, MD, [facility] 03/17/2013 4:07 PM   CC:The Patient  and Heide Scales, NP

## 2013-03-17 NOTE — Patient Instructions (Addendum)
The stomach is irritated, I think - biopsies taken as I suspect gastritis.  There were 7 polyps removed from the colon - they all look benign.  I am not sure what is causing your pain and vomiting. We will notify you regarding biopsy results and plans.  For now stay on the pantoprazole. I am adding a medicine called glycopyrrolate also - it should help pain. I will refill the Percocet for now but do not plan to treat your pain chronically with that.  I appreciate the opportunity to care for you. Gatha Mayer, MD, FACG   YOU HAD AN ENDOSCOPIC PROCEDURE TODAY AT [facility]: Refer to the procedure report that was given to you for any specific questions about what was found during the examination.  If the procedure report does not answer your questions, please call your gastroenterologist to clarify.  If you requested that your care partner not be given the details of your procedure findings, then the procedure report has been included in a sealed envelope for you to review at your convenience later.  YOU SHOULD EXPECT: Some feelings of bloating in the abdomen. Passage of more gas than usual.  Walking can help get rid of the air that was put into your GI tract during the procedure and reduce the bloating. If you had a lower endoscopy (such as a colonoscopy or flexible sigmoidoscopy) you may notice spotting of blood in your stool or on the toilet paper. If you underwent a bowel prep for your procedure, then you may not have a normal bowel movement for a few days.  DIET: Your first meal following the procedure should be a light meal and then it is ok to progress to your normal diet.  A half-sandwich or bowl of soup is an example of a good first meal.  Heavy or fried foods are harder to digest and may make you feel nauseous or bloated.  Likewise meals heavy in dairy and vegetables can cause extra gas to form and this can also increase the bloating.  Drink plenty of fluids but you  should avoid alcoholic beverages for 24 hours.  ACTIVITY: Your care partner should take you home directly after the procedure.  You should plan to take it easy, moving slowly for the rest of the day.  You can resume normal activity the day after the procedure however you should NOT DRIVE or use heavy machinery for 24 hours (because of the sedation medicines used during the test).    SYMPTOMS TO REPORT IMMEDIATELY: A gastroenterologist can be reached at any hour.  During normal business hours, 8:30 AM to 5:00 PM Monday through Friday, call [phone].  After hours and on weekends, please call the GI answering service at [phone] who will take a message and have the physician on call contact you.   Following lower endoscopy (colonoscopy or flexible sigmoidoscopy):  Excessive amounts of blood in the stool  Significant tenderness or worsening of abdominal pains  Swelling of the abdomen that is new, acute  Fever of 100F or higher  Following upper endoscopy (EGD)  Vomiting of blood or coffee ground material  New chest pain or pain under the shoulder blades  Painful or persistently difficult swallowing  New shortness of breath  Fever of 100F or higher  Black, tarry-looking stools  FOLLOW UP: If any biopsies were taken you will be contacted by phone or by letter within the next 1-3 weeks.  Call your gastroenterologist if you have  not heard about the biopsies in 3 weeks.  Our staff will call the home number listed on your records the next business day following your procedure to check on you and address any questions or concerns that you may have at that time regarding the information given to you following your procedure. This is a courtesy call and so if there is no answer at the home number and we have not heard from you through the emergency physician on call, we will assume that you have returned to your regular daily activities without incident.  SIGNATURES/CONFIDENTIALITY: You  and/or your care partner have signed paperwork which will be entered into your electronic medical record.  These signatures attest to the fact that that the information above on your After Visit Summary has been reviewed and is understood.  Full responsibility of the confidentiality of this discharge information lies with you and/or your care-partner.   INFORMATION ON POLYPS GIVEN TO YOU TODAY  HOLD ASPIRIN AND ANTI INFLAMMATORY PRODUCTS FOR 2 WEEKS  AWAIT BIOPSY RESULTS  CONTINUE ANTI REFLUX MEDICATION   GLYCOPYRROLATE SENT TO YOUR PHARMACY ([facility]) FOR YOU TO PICK UP  STAY OFF GOODYS   PERCOCET RX GIVEN TO YOU BY DR Carlean Purl

## 2013-03-18 ENCOUNTER — Telehealth: Payer: Self-pay

## 2013-03-18 NOTE — Telephone Encounter (Signed)
  Follow up Call-  Call back number 03/17/2013  Post procedure Call Back phone  # [phone]  Permission to leave phone message Yes     Patient questions:  Do you have a fever, pain , or abdominal swelling? no Pain Score  0 *  Have you tolerated food without any problems? yes  Have you been able to return to your normal activities? yes  Do you have any questions about your discharge instructions: Diet   no Medications  no Follow up visit  no  Do you have questions or concerns about your Care? no  Actions: * If pain score is 4 or above: No action needed, pain <4.

## 2013-03-26 ENCOUNTER — Encounter: Payer: Self-pay | Admitting: Internal Medicine

## 2013-03-26 NOTE — Progress Notes (Signed)
Quick Note:  Call patient - mild stomach inflammation Polyps benign - will need repeat colon 3 years Stay on current meds and see me in may  [place] no letter Place recall please  ______

## 2013-05-03 ENCOUNTER — Telehealth: Payer: Self-pay | Admitting: Internal Medicine

## 2013-05-03 DIAGNOSIS — R1013 Epigastric pain: Secondary | ICD-10-CM

## 2013-05-03 DIAGNOSIS — K2970 Gastritis, unspecified, without bleeding: Secondary | ICD-10-CM

## 2013-05-03 DIAGNOSIS — K2990 Gastroduodenitis, unspecified, without bleeding: Secondary | ICD-10-CM

## 2013-05-03 MED ORDER — PANTOPRAZOLE SODIUM 40 MG PO TBEC: 40.0000 mg | DELAYED_RELEASE_TABLET | Freq: Two times a day (BID) | ORAL | Status: DC

## 2013-05-03 MED ORDER — GLYCOPYRROLATE 2 MG PO TABS: 2.0000 mg | ORAL_TABLET | Freq: Two times a day (BID) | ORAL | Status: DC

## 2013-05-03 NOTE — Telephone Encounter (Signed)
Spoke to patient and confirmed pharmacy, refills sent in as requested.

## 2013-06-08 ENCOUNTER — Telehealth: Payer: Self-pay | Admitting: Internal Medicine

## 2013-06-08 MED ORDER — PANTOPRAZOLE SODIUM 40 MG PO TBEC: 40.0000 mg | DELAYED_RELEASE_TABLET | Freq: Two times a day (BID) | ORAL | Status: DC

## 2013-06-08 NOTE — Telephone Encounter (Signed)
Refill sent in as requested. 

## 2013-09-21 ENCOUNTER — Telehealth: Payer: Self-pay | Admitting: Internal Medicine

## 2013-09-21 MED ORDER — PANTOPRAZOLE SODIUM 40 MG PO TBEC: 40.0000 mg | DELAYED_RELEASE_TABLET | Freq: Two times a day (BID) | ORAL | Status: DC

## 2013-09-21 NOTE — Telephone Encounter (Signed)
Refill sent in as requested. 

## 2014-01-17 ENCOUNTER — Other Ambulatory Visit: Payer: Self-pay | Admitting: Internal Medicine

## 2014-03-15 ENCOUNTER — Other Ambulatory Visit: Payer: Self-pay

## 2014-03-15 MED ORDER — PANTOPRAZOLE SODIUM 40 MG PO TBEC: DELAYED_RELEASE_TABLET | ORAL | Status: DC

## 2014-04-18 ENCOUNTER — Telehealth: Payer: Self-pay | Admitting: Internal Medicine

## 2014-04-18 MED ORDER — PANTOPRAZOLE SODIUM 40 MG PO TBEC: DELAYED_RELEASE_TABLET | ORAL | Status: DC

## 2014-04-18 NOTE — Telephone Encounter (Signed)
Spoke with patient and confirmed the pharmacy before sending rx.

## 2014-06-07 ENCOUNTER — Encounter: Payer: Self-pay | Admitting: Physician Assistant

## 2014-06-07 ENCOUNTER — Other Ambulatory Visit (INDEPENDENT_AMBULATORY_CARE_PROVIDER_SITE_OTHER): Payer: 59

## 2014-06-07 ENCOUNTER — Ambulatory Visit (INDEPENDENT_AMBULATORY_CARE_PROVIDER_SITE_OTHER): Payer: 59 | Admitting: Physician Assistant

## 2014-06-07 VITALS — BP 122/70 | HR 68 | Ht 66.0 in | Wt 142.4 lb

## 2014-06-07 DIAGNOSIS — R1013 Epigastric pain: Secondary | ICD-10-CM

## 2014-06-07 DIAGNOSIS — K219 Gastro-esophageal reflux disease without esophagitis: Secondary | ICD-10-CM | POA: Diagnosis not present

## 2014-06-07 LAB — AMYLASE: Amylase: 39 U/L (ref 27–131)

## 2014-06-07 LAB — HEPATIC FUNCTION PANEL
ALK PHOS: 77 U/L (ref 39–117)
ALT: 13 U/L (ref 0–35)
AST: 15 U/L (ref 0–37)
Albumin: 4.2 g/dL (ref 3.5–5.2)
BILIRUBIN DIRECT: 0.1 mg/dL (ref 0.0–0.3)
Total Bilirubin: 0.3 mg/dL (ref 0.2–1.2)
Total Protein: 7.2 g/dL (ref 6.0–8.3)

## 2014-06-07 LAB — LIPASE: Lipase: 15 U/L (ref 11.0–59.0)

## 2014-06-07 MED ORDER — RANITIDINE HCL 300 MG PO TABS: 300.0000 mg | ORAL_TABLET | Freq: Every day | ORAL | Status: DC

## 2014-06-07 MED ORDER — PANTOPRAZOLE SODIUM 40 MG PO TBEC: DELAYED_RELEASE_TABLET | ORAL | Status: DC

## 2014-06-07 NOTE — Patient Instructions (Addendum)
Please call to schedule a follow up for 3 months with Cecille Rubin or Dr Carlean Purl.  Your physician has requested that you go to the basement for lab work before leaving today.  We have sent the following medications to your pharmacy for you to pick up at your convenience:  Continue pantoprazole 40mg  twice daily. Zantac 300mg  at bedtime. Avoid eating for 3 hours before you go to bed.  Food Choices for Gastroesophageal Reflux Disease When you have gastroesophageal reflux disease (GERD), the foods you eat and your eating habits are very important. Choosing the right foods can help ease the discomfort of GERD. WHAT GENERAL GUIDELINES DO I NEED TO FOLLOW?  Choose fruits, vegetables, whole grains, low-fat dairy products, and low-fat meat, fish, and poultry.  Limit fats such as oils, salad dressings, butter, nuts, and avocado.  Keep a food diary to identify foods that cause symptoms.  Avoid foods that cause reflux. These may be different for different people.  Eat frequent small meals instead of three large meals each day.  Eat your meals slowly, in a relaxed setting.  Limit fried foods.  Cook foods using methods other than frying.  Avoid drinking alcohol.  Avoid drinking large amounts of liquids with your meals.  Avoid bending over or lying down until 2-3 hours after eating. WHAT FOODS ARE NOT RECOMMENDED? The following are some foods and drinks that may worsen your symptoms: Vegetables Tomatoes. Tomato juice. Tomato and spaghetti sauce. Chili peppers. Onion and garlic. Horseradish. Fruits Oranges, grapefruit, and lemon (fruit and juice). Meats High-fat meats, fish, and poultry. This includes hot dogs, ribs, ham, sausage, salami, and bacon. Dairy Whole milk and chocolate milk. Sour cream. Cream. Butter. Ice cream. Cream cheese.  Beverages Coffee and tea, with or without caffeine. Carbonated beverages or energy drinks. Condiments Hot sauce. Barbecue sauce.  Sweets/Desserts Chocolate  and cocoa. Donuts. Peppermint and spearmint. Fats and Oils High-fat foods, including [place] fries and potato chips. Other Vinegar. Strong spices, such as black pepper, white pepper, red pepper, cayenne, curry powder, cloves, ginger, and chili powder. The items listed above may not be a complete list of foods and beverages to avoid. Contact your dietitian for more information. Document Released: 12/31/2004 Document Revised: 01/05/2013 Document Reviewed: 11/04/2012 [facility] Patient Information 2015 [place], [place]. This information is not intended to replace advice given to you by your health care provider. Make sure you discuss any questions you have with your health care provider.

## 2014-06-07 NOTE — Progress Notes (Signed)
Patient ID: Elizabeth Cox, female   DOB: Jun 11, 1955, 59 y.o.   MRN: [id number]     History of Present Illness: MAEDELL HEDGER is a delightful 59 year old female known to Dr. Carlean Purl. She was initially seen in the office in February 2015 for epigastric pain, nausea, and vomiting. She had a cholecystectomy in 2011 and at that time was noted to have fatty liver on ultrasound. She also reported at that visit that her mother had colon cancer diagnosed at 35.  Madie had a colonoscopy on 03/17/2013. 7 sessile polyps measuring 3-10 mm in size were found in the descending colon. Pathology revealed tubular adenomas and she was advised to have surveillance in 3 years. She also had an EGD on 03/17/2013 that showed gastritis in the gastric antrum. Biopsies showed chronic inactive gastritis with no evidence of H. pylori, goblet cell metaplasia, dysplasia, or malignancy. She has been on Protonix twice daily but despite this continues to get heartburn every night. She typically eats supper around 6:30 and then has ice cream around 9:30 or 10 and goes to bed. She drinks 2 cups of coffee each morning but has several sodas throughout the day. She continues to have an affinity for spicy food though she says she doesn't have it every day anymore. She continues to smoke.   Past Medical History  Diagnosis Date  . Arthritis   . COPD (chronic obstructive pulmonary disease)   . Gallstones   . Pneumonia   . Emphysema of lung   . GERD (gastroesophageal reflux disease)   . Personal history of colonic polyps - adenomas 03/17/2013    03/17/2013 7 left polyps removed max 10 mm      Past Surgical History  Procedure Laterality Date  . Cholecystectomy    . Tonsillectomy     Family History  Problem Relation Age of Onset  . Colon cancer Mother 98  . Diabetes Mother   . Heart disease Mother    History  Substance Use Topics  . Smoking status: Current Every Day Smoker -- 1.00 packs/day    Types: Cigarettes  . Smokeless tobacco:  Never Used  . Alcohol Use: No   Current Outpatient Prescriptions  Medication Sig Dispense Refill  . glycopyrrolate (ROBINUL) 2 MG tablet Take 1 tablet (2 mg total) by mouth 2 (two) times daily. 60 tablet 0  . oxyCODONE-acetaminophen (PERCOCET) 10-325 MG per tablet Take 1 tablet by mouth as needed for pain. 20 tablet 0  . pantoprazole (PROTONIX) 40 MG tablet TAKE ONE TABLET BY MOUTH TWICE DAILY. 60 tablet 3  . ranitidine (ZANTAC) 300 MG tablet Take 1 tablet (300 mg total) by mouth at bedtime. 90 tablet 3   No current facility-administered medications for this visit.   Allergies  Allergen Reactions  . Vicodin [Hydrocodone-Acetaminophen] Nausea And Vomiting      Review of Systems: Per history of present illness otherwise negative    Physical Exam: General: Pleasant, well developed female in no acute distress who smells of cigarettes Head: Normocephalic and atraumatic Eyes:  sclerae anicteric, conjunctiva pink  Ears: Normal auditory acuity Lungs: Clear throughout to auscultation Heart: Regular rate and rhythm Abdomen: Soft, non distended, non-tender. No masses, no hepatomegaly. Normal bowel sounds Musculoskeletal: Symmetrical with no gross deformities  Extremities: No edema  Neurological: Alert oriented x 4, grossly nonfocal Psychological:  Alert and cooperative. Normal mood and affect  Assessment and Recommendations: 59 year old female with a history of GERD and gastritis here for follow-up. She continues to have some breakthrough  heartburn. An antireflux regimen has been reviewed with her and she's been instructed to try to avoid eating or drinking for 3 hours before she retired. She's been instructed to try to decrease ingestion of carbonated beverages and to try to stop smoking. She will continue twice a day pantoprazole and will be given a trial of ranitidine 300 mg at at bedtime. A hepatic panel, lipase, and amylase will be obtained. She will follow up in 3 months, sooner if  needed.        Ren Aspinall, Deloris Ping 06/07/2014,

## 2014-06-08 ENCOUNTER — Ambulatory Visit: Payer: Self-pay | Admitting: Internal Medicine

## 2014-06-17 NOTE — Progress Notes (Signed)
Agree w/ Ms. Hvozdovic's note and mangement.  

## 2014-10-10 ENCOUNTER — Other Ambulatory Visit: Payer: Self-pay | Admitting: Internal Medicine

## 2015-03-10 ENCOUNTER — Other Ambulatory Visit: Payer: Self-pay | Admitting: Internal Medicine

## 2015-03-27 ENCOUNTER — Emergency Department (HOSPITAL_COMMUNITY): Payer: BLUE CROSS/BLUE SHIELD

## 2015-03-27 ENCOUNTER — Encounter (HOSPITAL_COMMUNITY): Payer: Self-pay | Admitting: Emergency Medicine

## 2015-03-27 ENCOUNTER — Emergency Department (HOSPITAL_COMMUNITY)
Admission: EM | Admit: 2015-03-27 | Discharge: 2015-03-27 | Disposition: A | Discharge status: Home / Self Care | Payer: BLUE CROSS/BLUE SHIELD | Attending: Physician Assistant | Admitting: Physician Assistant

## 2015-03-27 DIAGNOSIS — Y9389 Activity, other specified: Secondary | ICD-10-CM | POA: Diagnosis not present

## 2015-03-27 DIAGNOSIS — Z8701 Personal history of pneumonia (recurrent): Secondary | ICD-10-CM | POA: Insufficient documentation

## 2015-03-27 DIAGNOSIS — M545 Low back pain, unspecified: Secondary | ICD-10-CM

## 2015-03-27 DIAGNOSIS — X500XXA Overexertion from strenuous movement or load, initial encounter: Secondary | ICD-10-CM | POA: Insufficient documentation

## 2015-03-27 DIAGNOSIS — S39012A Strain of muscle, fascia and tendon of lower back, initial encounter: Secondary | ICD-10-CM | POA: Diagnosis not present

## 2015-03-27 DIAGNOSIS — Z86010 Personal history of colonic polyps: Secondary | ICD-10-CM | POA: Diagnosis not present

## 2015-03-27 DIAGNOSIS — Z79899 Other long term (current) drug therapy: Secondary | ICD-10-CM | POA: Insufficient documentation

## 2015-03-27 DIAGNOSIS — S32040A Wedge compression fracture of fourth lumbar vertebra, initial encounter for closed fracture: Secondary | ICD-10-CM | POA: Diagnosis not present

## 2015-03-27 DIAGNOSIS — Y998 Other external cause status: Secondary | ICD-10-CM | POA: Diagnosis not present

## 2015-03-27 DIAGNOSIS — Y9289 Other specified places as the place of occurrence of the external cause: Secondary | ICD-10-CM | POA: Diagnosis not present

## 2015-03-27 DIAGNOSIS — K219 Gastro-esophageal reflux disease without esophagitis: Secondary | ICD-10-CM | POA: Diagnosis not present

## 2015-03-27 DIAGNOSIS — M1990 Unspecified osteoarthritis, unspecified site: Secondary | ICD-10-CM | POA: Diagnosis not present

## 2015-03-27 DIAGNOSIS — F17210 Nicotine dependence, cigarettes, uncomplicated: Secondary | ICD-10-CM | POA: Insufficient documentation

## 2015-03-27 DIAGNOSIS — J439 Emphysema, unspecified: Secondary | ICD-10-CM | POA: Diagnosis not present

## 2015-03-27 DIAGNOSIS — G8929 Other chronic pain: Secondary | ICD-10-CM | POA: Diagnosis not present

## 2015-03-27 DIAGNOSIS — IMO0002 Reserved for concepts with insufficient information to code with codable children: Secondary | ICD-10-CM

## 2015-03-27 DIAGNOSIS — T148XXA Other injury of unspecified body region, initial encounter: Secondary | ICD-10-CM

## 2015-03-27 MED ORDER — OXYCODONE-ACETAMINOPHEN 7.5-325 MG PO TABS
1.0000 | ORAL_TABLET | Freq: Once | ORAL | Status: AC
  Administered 2015-03-27: 1 via ORAL
  Filled 2015-03-27: qty 1

## 2015-03-27 MED ORDER — IBUPROFEN 800 MG PO TABS
800.0000 mg | ORAL_TABLET | Freq: Once | ORAL | Status: AC
  Administered 2015-03-27: 800 mg via ORAL
  Filled 2015-03-27: qty 1

## 2015-03-27 MED ORDER — OXYCODONE HCL 5 MG PO TABS: 5.0000 mg | ORAL_TABLET | ORAL | Status: DC | PRN

## 2015-03-27 NOTE — ED Notes (Signed)
Patient presents for lower back pain, tender to palpation, patient reports she moved a mattress and box spring by herself earlier this month. Denies numbness or tingling, denies urinary or bowel incontinence after injury. Ambulatory to triage.

## 2015-03-27 NOTE — Discharge Instructions (Signed)

## 2015-03-27 NOTE — ED Provider Notes (Signed)
CSN: [id number]     Arrival date & time 03/27/15  1124 History  By signing my name below, I, Soijett Blue, attest that this documentation has been prepared under the direction and in the presence of Gloriann Loan, PA-C Electronically Signed: Soijett Blue, ED Scribe. 03/27/2015. 12:59 PM.    Chief Complaint  Patient presents with  . Back Pain     The history is provided by the patient. No language interpreter was used.    HPI Comments: Elizabeth Cox is a 60 y.o. female with a medical hx of arthritis and chronic back pain who presents to the Emergency Department complaining of constant, moderate, right lower back pain onset 12 days ago. She reports that she recently moved a mattress/box spring by herself. She states that her lower back pain will intermittently shoot across her lower back, but the pain is localized to her right lower back. Pt notes that she went to her PCP who informed her that if the back pain is still occurring to go for imaging at [facility]. Pt has daily percocet that she uses for her chronic back pain Rx by her PCP. Pt notes that her PCP gave her 5 mg percocet to add to her nl dosage of percocet to last until she was able to get imaging completed today. Pt PCP Rx steroids for the treatment of her symptoms. Pt didn't go to [place] imaging today due to increasing right lower back pain. She is ambulatory.   She reports that the back pain does radiate to her abdomen and to her right knee. Pt right lower back pain is worsened with movement and palpation, there are no alleviating factors. She states that she is having associated symptoms of chills and subjective fever. She states that she has tried Rx percocet and steroids with no relief for her symptoms. Pt denies numbness, tingling, dysuria, hematuria, bowel/bladder incontinence, abdominal pain and any other symptoms. Denies PMHx of CA or IV drug use.   Past Medical History  Diagnosis Date  . Arthritis   . COPD (chronic  obstructive pulmonary disease) ([place])   . Gallstones   . Pneumonia   . Emphysema of lung ([place])   . GERD (gastroesophageal reflux disease)   . Personal history of colonic polyps - adenomas 03/17/2013    03/17/2013 7 left polyps removed max 10 mm     Past Surgical History  Procedure Laterality Date  . Cholecystectomy    . Tonsillectomy     Family History  Problem Relation Age of Onset  . Colon cancer Mother 57  . Diabetes Mother   . Heart disease Mother    Social History  Substance Use Topics  . Smoking status: Current Every Day Smoker -- 1.00 packs/day    Types: Cigarettes  . Smokeless tobacco: Never Used  . Alcohol Use: No   OB History    No data available     Review of Systems  Gastrointestinal: Negative for abdominal pain.  Musculoskeletal: Positive for back pain. Negative for gait problem.  Skin: Negative for color change, rash and wound.  Neurological: Negative for numbness.       No tingling      Allergies  Vicodin  Home Medications   Prior to Admission medications   Medication Sig Start Date End Date Taking? Authorizing Provider  glycopyrrolate (ROBINUL) 2 MG tablet Take 1 tablet (2 mg total) by mouth 2 (two) times daily. 05/03/13   Gatha Mayer, MD  oxyCODONE-acetaminophen (PERCOCET) 10-325 MG per  tablet Take 1 tablet by mouth as needed for pain. 03/17/13   Gatha Mayer, MD  pantoprazole (PROTONIX) 40 MG tablet TAKE 1 TABLET BY MOUTH TWICE DAILY 03/10/15   Gatha Mayer, MD  ranitidine (ZANTAC) 300 MG tablet Take 1 tablet (300 mg total) by mouth at bedtime. 06/07/14   Lori P Hvozdovic, PA-C   BP 140/82 mmHg  Pulse 96  Temp(Src) 98.4 F (36.9 C) (Oral)  Resp 16  SpO2 100% Physical Exam  Constitutional: She is oriented to person, place, and time. She appears well-developed and well-nourished.  HENT:  Head: Atraumatic.  Eyes: Conjunctivae are normal.  Cardiovascular: Normal rate, regular rhythm, normal heart sounds and intact distal pulses.   Pulses:       Dorsalis pedis pulses are 2+ on the right side, and 2+ on the left side.  Pulmonary/Chest: Effort normal and breath sounds normal.  Abdominal: Soft. Bowel sounds are normal. She exhibits no distension. There is no tenderness.  Musculoskeletal: She exhibits tenderness.       Lumbar back: She exhibits decreased range of motion, tenderness and pain.       Back:  No spinous process tenderness.  No step offs. No crepitus. TTP over right lumbar musculature. Decreased ROM in lumbar flexion due to pain.   Neurological: She is alert and oriented to person, place, and time.  No saddle anesthesia. Strength and sensation intact bilaterally throughout lower extremities.  Skin: Skin is warm and dry.  Psychiatric: She has a normal mood and affect. Her behavior is normal.  Nursing note and vitals reviewed.   ED Course  Procedures (including critical care time) DIAGNOSTIC STUDIES: Oxygen Saturation is 100% on RA, nl by my interpretation.    COORDINATION OF CARE: 12:56 PM Discussed treatment plan with pt at bedside which includes lumbar spine xray and pt agreed to plan.  12:56 PM- Pt was offered a muscle relaxer and anti-inflammatory medications for her symptoms, to which she declined stating "those don't do anything for my symptoms, I'm going to need something for pain.  Labs Review Labs Reviewed - No data to display  Imaging Review Dg Lumbar Spine Complete  03/27/2015  CLINICAL DATA:  Low back pain since a lifting injury on 03/15/2015 EXAM: LUMBAR SPINE - COMPLETE 4+ VIEW COMPARISON:  CT scan dated 12/23/2014 FINDINGS: There is an acute slight compression fracture anterior superior aspect of L4. No visible protrusion of bone into the spinal canal. No bone destruction. No disc space narrowing. Minimal degenerative changes of the facet joints at L5-S1. IMPRESSION: Benign-appearing slight compression fracture of the anterior superior aspect of L4, new since 02/11/2014. Electronically Signed   By: Lorriane Shire M.D.   On: 03/27/2015 13:46   I have personally reviewed and evaluated these images as part of my medical decision-making.   EKG Interpretation None      MDM   Final diagnoses:  Chronic pain  Right-sided low back pain without sciatica  Muscle strain  Compression fracture    Patient presents with low back pain.  VSS.  No injury/trauma.  No red flags.  No neurological deficits.  Distal pulses intact.  No gait abnormalities.  Suspect low back strain or other mechanical cause.  Doubt cauda equina.  Doubt infectious process.  Doubt AAA.  [place] controlled substance reporting system, patient prescribed 30 day supply of oxycodone-APAP 02/15/15 and prescribed oxycodone 5 day supply 03/21/15.  Patient given one dose percocet and ibuprofen in ED.  Plain films show benign-appearing slight  compression fracture of the anterior superior aspect of L4, new since 02/11/14.  No indication for further imaging. Patient given 5 mg oxycodone and urged to follow up with PCP for management of chronic back pain.  Return precautions discussed.  Case has been discussed with Dr. Thomasene Lot who agrees with the above plan for discharge.    I personally performed the services described in this documentation, which was scribed in my presence. The recorded information has been reviewed and is accurate.    Gloriann Loan, PA-C 03/27/15 1403  Courteney Lyn Mackuen, MD 03/27/15 1655

## 2015-03-29 ENCOUNTER — Other Ambulatory Visit: Payer: Self-pay | Admitting: Internal Medicine

## 2015-03-29 DIAGNOSIS — IMO0002 Reserved for concepts with insufficient information to code with codable children: Secondary | ICD-10-CM

## 2015-03-31 ENCOUNTER — Ambulatory Visit
Admission: RE | Admit: 2015-03-31 | Discharge: 2015-03-31 | Disposition: A | Discharge status: Home / Self Care | Payer: BLUE CROSS/BLUE SHIELD | Source: Ambulatory Visit | Attending: Internal Medicine | Admitting: Internal Medicine

## 2015-03-31 DIAGNOSIS — IMO0002 Reserved for concepts with insufficient information to code with codable children: Secondary | ICD-10-CM

## 2015-04-07 ENCOUNTER — Other Ambulatory Visit: Payer: BLUE CROSS/BLUE SHIELD

## 2015-04-20 DIAGNOSIS — M549 Dorsalgia, unspecified: Secondary | ICD-10-CM | POA: Diagnosis not present

## 2015-04-20 DIAGNOSIS — M4806 Spinal stenosis, lumbar region: Secondary | ICD-10-CM | POA: Diagnosis not present

## 2015-04-20 DIAGNOSIS — S32001A Stable burst fracture of unspecified lumbar vertebra, initial encounter for closed fracture: Secondary | ICD-10-CM | POA: Diagnosis not present

## 2015-04-25 DIAGNOSIS — Z0000 Encounter for general adult medical examination without abnormal findings: Secondary | ICD-10-CM | POA: Diagnosis not present

## 2015-04-25 DIAGNOSIS — E559 Vitamin D deficiency, unspecified: Secondary | ICD-10-CM | POA: Diagnosis not present

## 2015-04-25 DIAGNOSIS — R8299 Other abnormal findings in urine: Secondary | ICD-10-CM | POA: Diagnosis not present

## 2015-04-26 DIAGNOSIS — Z0000 Encounter for general adult medical examination without abnormal findings: Secondary | ICD-10-CM | POA: Diagnosis not present

## 2015-04-26 DIAGNOSIS — K219 Gastro-esophageal reflux disease without esophagitis: Secondary | ICD-10-CM | POA: Diagnosis not present

## 2015-04-26 DIAGNOSIS — R3129 Other microscopic hematuria: Secondary | ICD-10-CM | POA: Diagnosis not present

## 2015-04-26 DIAGNOSIS — E559 Vitamin D deficiency, unspecified: Secondary | ICD-10-CM | POA: Diagnosis not present

## 2015-04-26 DIAGNOSIS — M810 Age-related osteoporosis without current pathological fracture: Secondary | ICD-10-CM | POA: Diagnosis not present

## 2015-04-26 DIAGNOSIS — Z6825 Body mass index (BMI) 25.0-25.9, adult: Secondary | ICD-10-CM | POA: Diagnosis not present

## 2015-04-27 ENCOUNTER — Other Ambulatory Visit: Payer: Self-pay | Admitting: Internal Medicine

## 2015-04-27 DIAGNOSIS — Z1231 Encounter for screening mammogram for malignant neoplasm of breast: Secondary | ICD-10-CM

## 2015-05-02 ENCOUNTER — Other Ambulatory Visit: Payer: Self-pay | Admitting: Internal Medicine

## 2015-05-09 DIAGNOSIS — Z1212 Encounter for screening for malignant neoplasm of rectum: Secondary | ICD-10-CM | POA: Diagnosis not present

## 2015-05-17 ENCOUNTER — Ambulatory Visit
Admission: RE | Admit: 2015-05-17 | Discharge: 2015-05-17 | Disposition: A | Discharge status: Home / Self Care | Payer: BLUE CROSS/BLUE SHIELD | Source: Ambulatory Visit | Attending: Internal Medicine | Admitting: Internal Medicine

## 2015-05-17 DIAGNOSIS — Z1231 Encounter for screening mammogram for malignant neoplasm of breast: Secondary | ICD-10-CM

## 2015-05-23 ENCOUNTER — Telehealth: Payer: Self-pay | Admitting: Internal Medicine

## 2015-05-23 MED ORDER — PANTOPRAZOLE SODIUM 40 MG PO TBEC: 40.0000 mg | DELAYED_RELEASE_TABLET | Freq: Two times a day (BID) | ORAL | Status: DC

## 2015-05-23 NOTE — Telephone Encounter (Signed)
Refill sent in as requested. 

## 2015-06-01 DIAGNOSIS — F17200 Nicotine dependence, unspecified, uncomplicated: Secondary | ICD-10-CM | POA: Diagnosis not present

## 2015-06-01 DIAGNOSIS — Z6824 Body mass index (BMI) 24.0-24.9, adult: Secondary | ICD-10-CM | POA: Diagnosis not present

## 2015-06-01 DIAGNOSIS — M810 Age-related osteoporosis without current pathological fracture: Secondary | ICD-10-CM | POA: Diagnosis not present

## 2015-07-20 ENCOUNTER — Other Ambulatory Visit: Payer: Self-pay

## 2015-07-20 MED ORDER — PANTOPRAZOLE SODIUM 40 MG PO TBEC: 40.0000 mg | DELAYED_RELEASE_TABLET | Freq: Two times a day (BID) | ORAL | Status: DC

## 2015-08-28 ENCOUNTER — Emergency Department (HOSPITAL_COMMUNITY)
Admission: EM | Admit: 2015-08-28 | Discharge: 2015-08-28 | Disposition: A | Discharge status: Home / Self Care | Payer: BLUE CROSS/BLUE SHIELD | Attending: Emergency Medicine | Admitting: Emergency Medicine

## 2015-08-28 ENCOUNTER — Telehealth: Payer: Self-pay | Admitting: Internal Medicine

## 2015-08-28 ENCOUNTER — Encounter (HOSPITAL_COMMUNITY): Payer: Self-pay

## 2015-08-28 DIAGNOSIS — R112 Nausea with vomiting, unspecified: Secondary | ICD-10-CM | POA: Insufficient documentation

## 2015-08-28 DIAGNOSIS — R1013 Epigastric pain: Secondary | ICD-10-CM | POA: Insufficient documentation

## 2015-08-28 DIAGNOSIS — F17210 Nicotine dependence, cigarettes, uncomplicated: Secondary | ICD-10-CM | POA: Insufficient documentation

## 2015-08-28 DIAGNOSIS — J449 Chronic obstructive pulmonary disease, unspecified: Secondary | ICD-10-CM | POA: Diagnosis not present

## 2015-08-28 LAB — CBC
HCT: 45.5 % (ref 36.0–46.0)
Hemoglobin: 15.4 g/dL — ABNORMAL HIGH (ref 12.0–15.0)
MCH: 30.5 pg (ref 26.0–34.0)
MCHC: 33.8 g/dL (ref 30.0–36.0)
MCV: 90.1 fL (ref 78.0–100.0)
PLATELETS: 312 10*3/uL (ref 150–400)
RBC: 5.05 MIL/uL (ref 3.87–5.11)
RDW: 14.3 % (ref 11.5–15.5)
WBC: 7.5 10*3/uL (ref 4.0–10.5)

## 2015-08-28 LAB — URINE MICROSCOPIC-ADD ON

## 2015-08-28 LAB — LIPASE, BLOOD: Lipase: 21 U/L (ref 11–51)

## 2015-08-28 LAB — COMPREHENSIVE METABOLIC PANEL
ALT: 35 U/L (ref 14–54)
AST: 31 U/L (ref 15–41)
Albumin: 4.5 g/dL (ref 3.5–5.0)
Alkaline Phosphatase: 84 U/L (ref 38–126)
Anion gap: 10 (ref 5–15)
BILIRUBIN TOTAL: 0.9 mg/dL (ref 0.3–1.2)
CO2: 21 mmol/L — ABNORMAL LOW (ref 22–32)
CREATININE: 0.61 mg/dL (ref 0.44–1.00)
Calcium: 9.5 mg/dL (ref 8.9–10.3)
Chloride: 98 mmol/L — ABNORMAL LOW (ref 101–111)
GFR calc Af Amer: >60 mL/min (ref >60)
Glucose, Bld: 105 mg/dL — ABNORMAL HIGH (ref 65–99)
Potassium: 4.1 mmol/L (ref 3.5–5.1)
Sodium: 129 mmol/L — ABNORMAL LOW (ref 135–145)
TOTAL PROTEIN: 7.8 g/dL (ref 6.5–8.1)

## 2015-08-28 LAB — URINALYSIS, ROUTINE W REFLEX MICROSCOPIC
Bilirubin Urine: NEGATIVE
Glucose, UA: NEGATIVE mg/dL
KETONES UR: NEGATIVE mg/dL
LEUKOCYTES UA: NEGATIVE
NITRITE: POSITIVE — AB
PROTEIN: NEGATIVE mg/dL
Specific Gravity, Urine: 1.004 — ABNORMAL LOW (ref 1.005–1.030)
pH: 7 (ref 5.0–8.0)

## 2015-08-28 MED ORDER — PROMETHAZINE HCL 25 MG PO TABS: 25.0000 mg | ORAL_TABLET | Freq: Four times a day (QID) | ORAL | 0 refills | Status: DC | PRN

## 2015-08-28 MED ORDER — SUCRALFATE 1 G PO TABS: 1.0000 g | ORAL_TABLET | Freq: Three times a day (TID) | ORAL | 0 refills | Status: DC

## 2015-08-28 MED ORDER — SODIUM CHLORIDE 0.9 % IV BOLUS (SEPSIS)
1000.0000 mL | Freq: Once | INTRAVENOUS | Status: AC
  Administered 2015-08-28: 1000 mL via INTRAVENOUS

## 2015-08-28 MED ORDER — ONDANSETRON HCL 4 MG/2ML IJ SOLN
4.0000 mg | Freq: Once | INTRAMUSCULAR | Status: AC
  Administered 2015-08-28: 4 mg via INTRAVENOUS
  Filled 2015-08-28: qty 2

## 2015-08-28 MED ORDER — OXYCODONE-ACETAMINOPHEN 5-325 MG PO TABS
2.0000 | ORAL_TABLET | Freq: Once | ORAL | Status: AC
  Administered 2015-08-28: 2 via ORAL
  Filled 2015-08-28: qty 2

## 2015-08-28 MED ORDER — GI COCKTAIL ~~LOC~~
30.0000 mL | Freq: Once | ORAL | Status: AC
  Administered 2015-08-28: 30 mL via ORAL
  Filled 2015-08-28: qty 30

## 2015-08-28 NOTE — ED Triage Notes (Signed)
Pt c/o increasing epigastric pain and emesis x 1 week.  Pain score 7/10.  Pt reports using her inhaler increased pain.  Hx of acid reflux and has taken Protonix x 3 years.

## 2015-08-28 NOTE — ED Notes (Signed)
Pt ambulated out of department without incident.  No reaction to medication noted.

## 2015-08-28 NOTE — Telephone Encounter (Signed)
Left message for patient to call back  

## 2015-08-28 NOTE — ED Provider Notes (Signed)
[place] [facility] DEPT Provider Note   CSN: [id number] Arrival date & time: 08/28/15  [id number]     History   Chief Complaint Chief Complaint  Patient presents with  . Abdominal Pain  . Emesis    HPI Elizabeth Cox is a 60 y.o. female.  The history is provided by the patient.  Patient presents with abdominal pain and nausea and vomiting. Has had it for the last week but worse last few days. States pain is severe. Worse after eating. Previous history of GERD. Has had previous scopes and said she had some gastric polyps. Sees Dr. Carlean Purl. No diarrhea. The pain is dull and constant. No relief with her chronic pain medications. She has previously had her gallbladder out. Patient states she is worried she has an ulcer. No black stool. No fevers or chills. States she used her inhaler for emphysema timing her abdomen hurt more.  Past Medical History:  Diagnosis Date  . Arthritis   . COPD (chronic obstructive pulmonary disease) ([place])   . Emphysema of lung ([place])   . Gallstones   . GERD (gastroesophageal reflux disease)   . Personal history of colonic polyps - adenomas 03/17/2013   03/17/2013 7 left polyps removed max 10 mm    . Pneumonia     Patient Active Problem List   Diagnosis Date Noted  . Personal history of colonic polyps - adenomas 03/17/2013  . COPD exacerbation ([place]) 02/19/2013  . S/P cholecystectomy 02/19/2013    Past Surgical History:  Procedure Laterality Date  . CHOLECYSTECTOMY    . TONSILLECTOMY      OB History    No data available       Home Medications    Prior to Admission medications   Medication Sig Start Date End Date Taking? Authorizing Provider  CALCIUM PO Take 1 tablet by mouth daily.   Yes Historical Provider, MD  Cholecalciferol (VITAMIN D) 2000 units tablet Take 2,000 Units by mouth daily.   Yes Historical Provider, MD  fentaNYL (DURAGESIC - DOSED MCG/HR) 50 MCG/HR Place 50 mcg onto the skin every 3 (three) days.   Yes Historical Provider, MD    oxyCODONE-acetaminophen (PERCOCET) 10-325 MG per tablet Take 1 tablet by mouth as needed for pain. Patient taking differently: Take 1 tablet by mouth 3 (three) times daily.  03/17/13  Yes Gatha Mayer, MD  pantoprazole (PROTONIX) 40 MG tablet Take 1 tablet (40 mg total) by mouth 2 (two) times daily. 07/20/15  Yes Gatha Mayer, MD  umeclidinium-vilanterol [facility] ELLIPTA) 62.5-25 MCG/INH AEPB Inhale 1 puff into the lungs daily.   Yes Historical Provider, MD  glycopyrrolate (ROBINUL) 2 MG tablet Take 1 tablet (2 mg total) by mouth 2 (two) times daily. Patient not taking: Reported on 08/28/2015 05/03/13   Gatha Mayer, MD  oxyCODONE (OXY IR/ROXICODONE) 5 MG immediate release tablet Take 1 tablet (5 mg total) by mouth every 4 (four) hours as needed for severe pain. Patient not taking: Reported on 08/28/2015 03/27/15   Gloriann Loan, PA-C  promethazine (PHENERGAN) 25 MG tablet Take 1 tablet (25 mg total) by mouth every 6 (six) hours as needed for nausea or vomiting. 08/28/15   Davonna Belling, MD  sucralfate (CARAFATE) 1 g tablet Take 1 tablet (1 g total) by mouth 4 (four) times daily -  with meals and at bedtime. 08/28/15   Davonna Belling, MD    Family History Family History  Problem Relation Age of Onset  . Colon cancer Mother 55  .  Diabetes Mother   . Heart disease Mother     Social History Social History  Substance Use Topics  . Smoking status: Current Every Day Smoker    Packs/day: 1.00    Types: Cigarettes  . Smokeless tobacco: Never Used  . Alcohol use No     Allergies   Morphine and related and Vicodin [hydrocodone-acetaminophen]   Review of Systems Review of Systems  Constitutional: Positive for appetite change.  HENT: Negative for trouble swallowing.   Eyes: Negative for pain.  Respiratory: Negative for shortness of breath.   Cardiovascular: Negative for chest pain.  Gastrointestinal: Positive for abdominal pain, nausea and vomiting.  Genitourinary: Negative for  difficulty urinating.  Musculoskeletal: Negative for back pain.  Skin: Negative for wound.  Neurological: Negative for weakness and numbness.  Hematological: Does not bruise/bleed easily.  Psychiatric/Behavioral: Negative for confusion.     Physical Exam Updated Vital Signs BP 151/94 (BP Location: Left Arm)   Pulse 100   Temp 98.4 F (36.9 C) (Oral)   Resp 18   SpO2 99%   Physical Exam  Constitutional: She appears well-developed.  HENT:  Head: Atraumatic.  Eyes: EOM are normal.  Neck: Neck supple.  Cardiovascular: Normal rate.   Pulmonary/Chest: Effort normal.  Abdominal: There is tenderness.  Epigastric tenderness without rebound or guarding. No masses.  Musculoskeletal: Normal range of motion.  Neurological: She is alert.  Skin: Skin is warm.  Psychiatric: She has a normal mood and affect.     ED Treatments / Results  Labs (all labs ordered are listed, but only abnormal results are displayed) Labs Reviewed  COMPREHENSIVE METABOLIC PANEL - Abnormal; Notable for the following:       Result Value   Sodium 129 (*)    Chloride 98 (*)    CO2 21 (*)    Glucose, Bld 105 (*)    BUN <5 (*)    All other components within normal limits  CBC - Abnormal; Notable for the following:    Hemoglobin 15.4 (*)    All other components within normal limits  URINALYSIS, ROUTINE W REFLEX MICROSCOPIC (NOT AT [facility]) - Abnormal; Notable for the following:    Specific Gravity, Urine 1.004 (*)    Hgb urine dipstick TRACE (*)    Nitrite POSITIVE (*)    All other components within normal limits  URINE MICROSCOPIC-ADD ON - Abnormal; Notable for the following:    Squamous Epithelial / LPF 0-5 (*)    Bacteria, UA RARE (*)    All other components within normal limits  LIPASE, BLOOD    EKG  EKG Interpretation None       Radiology No results found.  Procedures Procedures (including critical care time)  Medications Ordered in ED Medications  gi cocktail  (Maalox,Lidocaine,Donnatal) (30 mLs Oral Given 08/28/15 1044)  ondansetron (ZOFRAN) injection 4 mg (4 mg Intravenous Given 08/28/15 1041)  sodium chloride 0.9 % bolus 1,000 mL (0 mLs Intravenous Stopped 08/28/15 1237)  oxyCODONE-acetaminophen (PERCOCET/ROXICET) 5-325 MG per tablet 2 tablet (2 tablets Oral Given 08/28/15 1126)     Initial Impression / Assessment and Plan / ED Course  I have reviewed the triage vital signs and the nursing notes.  Pertinent labs & imaging results that were available during my care of the patient were reviewed by me and considered in my medical decision making (see chart for details).  Clinical Course    Patient with epigastric pain nausea vomiting. Feels better after treatment. Lab work overall reassuring but does  have a hyponatremia. Reportedly has had hyponatremia in the past. Feels better after IV fluids and be discharged home. Will add Carafate and will follow-up with her gastroenterologist.  Final Clinical Impressions(s) / ED Diagnoses   Final diagnoses:  Non-intractable vomiting with nausea, vomiting of unspecified type  Epigastric pain    New Prescriptions New Prescriptions   PROMETHAZINE (PHENERGAN) 25 MG TABLET    Take 1 tablet (25 mg total) by mouth every 6 (six) hours as needed for nausea or vomiting.   SUCRALFATE (CARAFATE) 1 G TABLET    Take 1 tablet (1 g total) by mouth 4 (four) times daily -  with meals and at bedtime.     Davonna Belling, MD 08/28/15 [phone]

## 2015-08-29 NOTE — Telephone Encounter (Signed)
Patient will come in and see Alonza Bogus, PA on 08/31/15 1:30.  Still having mild pain and nausea

## 2015-08-31 ENCOUNTER — Ambulatory Visit (INDEPENDENT_AMBULATORY_CARE_PROVIDER_SITE_OTHER): Payer: BLUE CROSS/BLUE SHIELD | Admitting: Gastroenterology

## 2015-08-31 ENCOUNTER — Encounter: Payer: Self-pay | Admitting: Gastroenterology

## 2015-08-31 VITALS — BP 100/58 | HR 84 | Ht 65.0 in | Wt 151.0 lb

## 2015-08-31 DIAGNOSIS — R1013 Epigastric pain: Secondary | ICD-10-CM

## 2015-08-31 DIAGNOSIS — K219 Gastro-esophageal reflux disease without esophagitis: Secondary | ICD-10-CM

## 2015-08-31 MED ORDER — SUCRALFATE 1 G PO TABS: 1.0000 g | ORAL_TABLET | Freq: Three times a day (TID) | ORAL | 2 refills | Status: DC

## 2015-08-31 MED ORDER — PANTOPRAZOLE SODIUM 40 MG PO TBEC: 40.0000 mg | DELAYED_RELEASE_TABLET | Freq: Every day | ORAL | 3 refills | Status: DC

## 2015-08-31 NOTE — Progress Notes (Signed)
     08/31/2015 MAKYNLIE FRANCA [id number] 03/21/1955   History of Present Illness:  This is a 60 year old female who is known to Dr. Carlean Purl for EGD and colonoscopy in March 2015. EGD at that time revealed gastritis with biopsies negative for H. pylori. She's been here for complaints of epigastric abdominal pain and reflux in the past. She has been on pantoprazole 40 mg twice daily.  She returns today with similar complaints. She complains of epigastric abdominal pain with upper abdominal bloating and heartburn/indigestion. She says that she was also having some vomiting associated with her symptoms and her reflux that was occurring about once a week.  She went to the emergency department on August 14 and at that time CBC, CMP, lipase were all relatively unremarkable except for a low sodium at 129 (appears this is a chronic issue for her, however).  She was started on Carafate tablet 4 times daily. She says that since beginning that medication just 3 days ago she has noticed significant improvement in her symptoms. She also admits that she has used some Goody powders here and there, but not nearly as much as she had been previously in the past. She does continue to smoke.   Current Medications, Allergies, Past Medical History, Past Surgical History, Family History and Social History were reviewed in [employer] record.   Physical Exam: BP (!) 100/58   Pulse 84   Ht 5\' 5"  (1.651 m)   Wt 151 lb (68.5 kg)   BMI 25.13 kg/m  General: Well developed female in no acute distress Head: Normocephalic and atraumatic Eyes:  Sclerae anicteric, conjunctiva pink  Ears: Normal auditory acuity Lungs: Clear throughout to auscultation Heart: Regular rate and rhythm Abdomen: Soft, non-distended.  Normal bowel sounds.  Mild epigastric TTP. Musculoskeletal: Symmetrical with no gross deformities  Extremities: No edema  Neurological: Alert oriented x 4, grossly non-focal Psychological:   Alert and cooperative. Normal mood and affect  Assessment and Recommendations: -60 year old female with complaints of GERD and and epigastric abdominal pain:  Has been on pantoprazole 40 mg twice daily. Symptoms now improved with the addition of Carafate tablet 4 times daily. She will continue this for now over the course of the next month or two (in addition to her usual pantoprazole 40 mg BID). If she is feeling well then would taper it down to discontinuation. I have also again advised her against the used of Goody powders. Her smoking is likely contributing to her reflux issues, etc. as well.

## 2015-08-31 NOTE — Patient Instructions (Addendum)
You have been given a separate informational sheet regarding your tobacco use, the importance of quitting and local resources to help you quit.  We have sent the following medications to your pharmacy for you to pick up at your convenience:  Pantoprazole  Carafate

## 2015-09-17 NOTE — Progress Notes (Signed)
Agree with Ms. Zehr's management.  Namita Yearwood E. Tamotsu Wiederholt, MD, FACG  

## 2015-09-19 DIAGNOSIS — R918 Other nonspecific abnormal finding of lung field: Secondary | ICD-10-CM | POA: Diagnosis not present

## 2015-09-19 DIAGNOSIS — Z23 Encounter for immunization: Secondary | ICD-10-CM | POA: Diagnosis not present

## 2015-09-19 DIAGNOSIS — K219 Gastro-esophageal reflux disease without esophagitis: Secondary | ICD-10-CM | POA: Diagnosis not present

## 2015-09-19 DIAGNOSIS — M810 Age-related osteoporosis without current pathological fracture: Secondary | ICD-10-CM | POA: Diagnosis not present

## 2015-09-21 ENCOUNTER — Other Ambulatory Visit: Payer: Self-pay | Admitting: Internal Medicine

## 2015-09-21 DIAGNOSIS — R918 Other nonspecific abnormal finding of lung field: Secondary | ICD-10-CM

## 2015-09-25 ENCOUNTER — Other Ambulatory Visit: Payer: BLUE CROSS/BLUE SHIELD

## 2015-09-26 ENCOUNTER — Telehealth: Payer: Self-pay | Admitting: Gastroenterology

## 2015-09-26 NOTE — Telephone Encounter (Signed)
Patient has been taking the pantoprazole twice daily which is correct. The Rx was sent in with the wrong SIG. Patient is aware. Call to the pharmacy and corrected the Rx.

## 2015-10-05 ENCOUNTER — Ambulatory Visit
Admission: RE | Admit: 2015-10-05 | Discharge: 2015-10-05 | Disposition: A | Discharge status: Home / Self Care | Payer: BLUE CROSS/BLUE SHIELD | Source: Ambulatory Visit | Attending: Internal Medicine | Admitting: Internal Medicine

## 2015-10-05 DIAGNOSIS — R918 Other nonspecific abnormal finding of lung field: Secondary | ICD-10-CM | POA: Diagnosis not present

## 2015-11-13 ENCOUNTER — Ambulatory Visit: Payer: BLUE CROSS/BLUE SHIELD | Admitting: Internal Medicine

## 2015-12-12 ENCOUNTER — Other Ambulatory Visit: Payer: Self-pay | Admitting: Gastroenterology

## 2015-12-20 DIAGNOSIS — R918 Other nonspecific abnormal finding of lung field: Secondary | ICD-10-CM | POA: Diagnosis not present

## 2015-12-20 DIAGNOSIS — K219 Gastro-esophageal reflux disease without esophagitis: Secondary | ICD-10-CM | POA: Diagnosis not present

## 2015-12-20 DIAGNOSIS — J439 Emphysema, unspecified: Secondary | ICD-10-CM | POA: Diagnosis not present

## 2015-12-20 DIAGNOSIS — F17200 Nicotine dependence, unspecified, uncomplicated: Secondary | ICD-10-CM | POA: Diagnosis not present

## 2016-01-05 ENCOUNTER — Other Ambulatory Visit: Payer: Self-pay

## 2016-01-05 MED ORDER — PANTOPRAZOLE SODIUM 40 MG PO TBEC: 40.0000 mg | DELAYED_RELEASE_TABLET | Freq: Two times a day (BID) | ORAL | 5 refills | Status: DC

## 2016-02-29 ENCOUNTER — Encounter: Payer: Self-pay | Admitting: Internal Medicine

## 2016-05-17 DIAGNOSIS — M810 Age-related osteoporosis without current pathological fracture: Secondary | ICD-10-CM | POA: Diagnosis not present

## 2016-05-17 DIAGNOSIS — E784 Other hyperlipidemia: Secondary | ICD-10-CM | POA: Diagnosis not present

## 2016-05-17 DIAGNOSIS — R3129 Other microscopic hematuria: Secondary | ICD-10-CM | POA: Diagnosis not present

## 2016-05-27 ENCOUNTER — Other Ambulatory Visit: Payer: Self-pay | Admitting: Internal Medicine

## 2016-05-27 DIAGNOSIS — R918 Other nonspecific abnormal finding of lung field: Secondary | ICD-10-CM | POA: Diagnosis not present

## 2016-05-27 DIAGNOSIS — Z0000 Encounter for general adult medical examination without abnormal findings: Secondary | ICD-10-CM | POA: Diagnosis not present

## 2016-05-27 DIAGNOSIS — E784 Other hyperlipidemia: Secondary | ICD-10-CM | POA: Diagnosis not present

## 2016-05-27 DIAGNOSIS — Z1389 Encounter for screening for other disorder: Secondary | ICD-10-CM | POA: Diagnosis not present

## 2016-05-27 DIAGNOSIS — K219 Gastro-esophageal reflux disease without esophagitis: Secondary | ICD-10-CM | POA: Diagnosis not present

## 2016-05-27 DIAGNOSIS — F17200 Nicotine dependence, unspecified, uncomplicated: Secondary | ICD-10-CM | POA: Diagnosis not present

## 2016-06-12 ENCOUNTER — Other Ambulatory Visit (HOSPITAL_COMMUNITY): Payer: Self-pay | Admitting: *Deleted

## 2016-06-12 ENCOUNTER — Encounter (HOSPITAL_COMMUNITY): Payer: BLUE CROSS/BLUE SHIELD

## 2016-06-13 ENCOUNTER — Ambulatory Visit (HOSPITAL_COMMUNITY)
Admission: RE | Admit: 2016-06-13 | Discharge: 2016-06-13 | Disposition: A | Discharge status: Home / Self Care | Payer: BLUE CROSS/BLUE SHIELD | Source: Ambulatory Visit | Attending: Internal Medicine | Admitting: Internal Medicine

## 2016-06-13 DIAGNOSIS — M810 Age-related osteoporosis without current pathological fracture: Secondary | ICD-10-CM | POA: Insufficient documentation

## 2016-06-13 MED ORDER — ZOLEDRONIC ACID 5 MG/100ML IV SOLN
5.0000 mg | Freq: Once | INTRAVENOUS | Status: AC
  Administered 2016-06-13: 5 mg via INTRAVENOUS

## 2016-06-13 MED ORDER — ZOLEDRONIC ACID 5 MG/100ML IV SOLN
INTRAVENOUS | Status: AC
  Filled 2016-06-13: qty 100

## 2016-07-03 ENCOUNTER — Other Ambulatory Visit: Payer: Self-pay

## 2016-07-03 MED ORDER — PANTOPRAZOLE SODIUM 40 MG PO TBEC: 40.0000 mg | DELAYED_RELEASE_TABLET | Freq: Two times a day (BID) | ORAL | 5 refills | Status: DC

## 2016-07-03 NOTE — Telephone Encounter (Signed)
Patient up to date on her visits so pantoprazole refilled.

## 2016-08-26 DIAGNOSIS — F17200 Nicotine dependence, unspecified, uncomplicated: Secondary | ICD-10-CM | POA: Diagnosis not present

## 2016-08-26 DIAGNOSIS — M545 Low back pain: Secondary | ICD-10-CM | POA: Diagnosis not present

## 2016-08-26 DIAGNOSIS — Z6823 Body mass index (BMI) 23.0-23.9, adult: Secondary | ICD-10-CM | POA: Diagnosis not present

## 2016-09-18 ENCOUNTER — Ambulatory Visit
Admission: RE | Admit: 2016-09-18 | Discharge: 2016-09-18 | Disposition: A | Discharge status: Home / Self Care | Payer: BLUE CROSS/BLUE SHIELD | Source: Ambulatory Visit | Attending: Internal Medicine | Admitting: Internal Medicine

## 2016-09-18 DIAGNOSIS — R918 Other nonspecific abnormal finding of lung field: Secondary | ICD-10-CM

## 2016-10-03 ENCOUNTER — Ambulatory Visit (INDEPENDENT_AMBULATORY_CARE_PROVIDER_SITE_OTHER): Payer: BLUE CROSS/BLUE SHIELD | Admitting: Pulmonary Disease

## 2016-10-03 ENCOUNTER — Encounter: Payer: Self-pay | Admitting: Pulmonary Disease

## 2016-10-03 DIAGNOSIS — Z23 Encounter for immunization: Secondary | ICD-10-CM

## 2016-10-03 DIAGNOSIS — J432 Centrilobular emphysema: Secondary | ICD-10-CM

## 2016-10-03 DIAGNOSIS — R918 Other nonspecific abnormal finding of lung field: Secondary | ICD-10-CM

## 2016-10-03 NOTE — Patient Instructions (Addendum)
Nodules are likely benign. Suggest repeat CT scan in one year without contrast  You have to quit smoking! Trial of Chantix  Breathing test appears good. Flu shot today

## 2016-10-03 NOTE — Assessment & Plan Note (Signed)
Nodules are likely benign. Largest 410 mm right lower lobe has been stable for a year 3 new nodules are noted in 20 1800 2017 more likely infectious or inflammatory Suggest repeat CT scan in one year without contrast

## 2016-10-03 NOTE — Assessment & Plan Note (Signed)
Surprisingly breathing test appears quite good-preserved FEV1. She can stop taking Anoro.  Flu shot today Pneumothorax as routine

## 2016-10-03 NOTE — Progress Notes (Signed)
Subjective:    Patient ID: Elizabeth Cox, female    DOB: 10-01-1955, 61 y.o.   MRN: [id number]  HPI  Chief Complaint  Patient presents with  . Pulm Consult    Pt referred by Dr. Dagmar Hait for pulm nodules. Pt has sore throats more often than normal, gets horseness with the sore throat.    61 year old smoker referred for evaluation of multiple pulmonary nodules. She smokes about 1.5 packs per day, starting at age 67-more than 60 pack years. She was told about 6 years ago that she may have COPD by Dr. Vear Clock. She has been on Anoro for about a year and feels that this helps her dyspnea. She denies chronic cough or wheezing or frequent chest colds. She developed hematuria and underwent CT abdomen in 12/2014 by urology which picked up multiple pulmonary nodules. CT chest 01/2015 confirmed multiple subcentimeter nodules. Follow-up scans were done in 09/2015 and 09/2016 which I have reviewed. The last scan from 09/2016 shows stable pulmonary nodules, largest 49 mm in the right lower lobe. There were 3 additional nodules about 3 mm not noted on the prior CT scan from 2017 but all felt to be infectious or inflammatory. No lymphadenopathy or pleural disease was noted  I was able to review a CT angio chest from 08/2009 -but nodules do not appear prominently on those images  She denies weight loss, fevers, loss of appetite.   Spirometry surprisingly showed a ratio of 76, FEV1 of 80% and FVC of 90%     Past Medical History:  Diagnosis Date  . Arthritis   . COPD (chronic obstructive pulmonary disease) ([place])   . Emphysema of lung ([place])   . Gallstones   . GERD (gastroesophageal reflux disease)   . Personal history of colonic polyps - adenomas 03/17/2013   03/17/2013 7 left polyps removed max 10 mm    . Pneumonia    Past Surgical History:  Procedure Laterality Date  . CHOLECYSTECTOMY    . TONSILLECTOMY      Allergies  Allergen Reactions  . Morphine And Related Nausea And Vomiting  . Vicodin  [Hydrocodone-Acetaminophen] Nausea And Vomiting     Social History   Social History  . Marital status: Legally Separated    Spouse name: N/A  . Number of children: 1  . Years of education: N/A   Occupational History  . None [employer]   Social History Main Topics  . Smoking status: Current Every Day Smoker    Packs/day: 1.50    Years: 45.00    Types: Cigarettes  . Smokeless tobacco: Never Used  . Alcohol use No  . Drug use: No  . Sexual activity: Not on file   Other Topics Concern  . Not on file   Social History Narrative  . No narrative on file    Family History  Problem Relation Age of Onset  . Colon cancer Mother 90  . Diabetes Mother   . Heart disease Mother   . Emphysema Mother      Review of Systems  Constitutional: Negative for fever and unexpected weight change.  HENT: Positive for sore throat. Negative for congestion, dental problem, ear pain, nosebleeds, postnasal drip, rhinorrhea, sinus pressure, sneezing and trouble swallowing.   Eyes: Negative for redness and itching.  Respiratory: Positive for shortness of breath and wheezing. Negative for cough and chest tightness.   Cardiovascular: Negative for palpitations and leg swelling.  Gastrointestinal: Negative for nausea and vomiting.  Genitourinary: Negative for dysuria.  Musculoskeletal: Negative for joint swelling.  Skin: Negative for rash.  Allergic/Immunologic: Negative.  Negative for environmental allergies, food allergies and immunocompromised state.  Neurological: Negative for headaches.  Hematological: Bruises/bleeds easily.  Psychiatric/Behavioral: Negative for dysphoric mood. The patient is not nervous/anxious.        Objective:   Physical Exam  Gen. Pleasant, well-nourished, in no distress, normal affect ENT - no lesions, no post nasal drip Neck: No JVD, no thyromegaly, no carotid bruits Lungs: no use of accessory muscles, no dullness to percussion, clear without rales or  rhonchi  Cardiovascular: Rhythm regular, heart sounds  normal, no murmurs or gallops, no peripheral edema Abdomen: soft and non-tender, no hepatosplenomegaly, BS normal. Musculoskeletal: No deformities, no cyanosis or clubbing Neuro:  alert, non focal       Assessment & Plan:

## 2016-12-27 ENCOUNTER — Other Ambulatory Visit: Payer: Self-pay | Admitting: Internal Medicine

## 2017-01-15 DIAGNOSIS — M4857XS Collapsed vertebra, not elsewhere classified, lumbosacral region, sequela of fracture: Secondary | ICD-10-CM | POA: Diagnosis not present

## 2017-01-15 DIAGNOSIS — M545 Low back pain: Secondary | ICD-10-CM | POA: Diagnosis not present

## 2017-01-15 DIAGNOSIS — R918 Other nonspecific abnormal finding of lung field: Secondary | ICD-10-CM | POA: Diagnosis not present

## 2017-01-15 DIAGNOSIS — F17200 Nicotine dependence, unspecified, uncomplicated: Secondary | ICD-10-CM | POA: Diagnosis not present

## 2017-04-17 DIAGNOSIS — M4857XS Collapsed vertebra, not elsewhere classified, lumbosacral region, sequela of fracture: Secondary | ICD-10-CM | POA: Diagnosis not present

## 2017-04-17 DIAGNOSIS — M545 Low back pain: Secondary | ICD-10-CM | POA: Diagnosis not present

## 2017-04-17 DIAGNOSIS — Z6824 Body mass index (BMI) 24.0-24.9, adult: Secondary | ICD-10-CM | POA: Diagnosis not present

## 2017-04-17 DIAGNOSIS — G8929 Other chronic pain: Secondary | ICD-10-CM | POA: Diagnosis not present

## 2017-05-16 IMAGING — DX DG LUMBAR SPINE COMPLETE 4+V
5 series · 5 of 5 positions shown · non-contrast
Comparison: CT scan dated 12/23/2014

CLINICAL DATA: Low back pain since a lifting injury on 03/15/2015

EXAM:
LUMBAR SPINE - COMPLETE 4+ VIEW

[l-spine obl (1 of 2)]
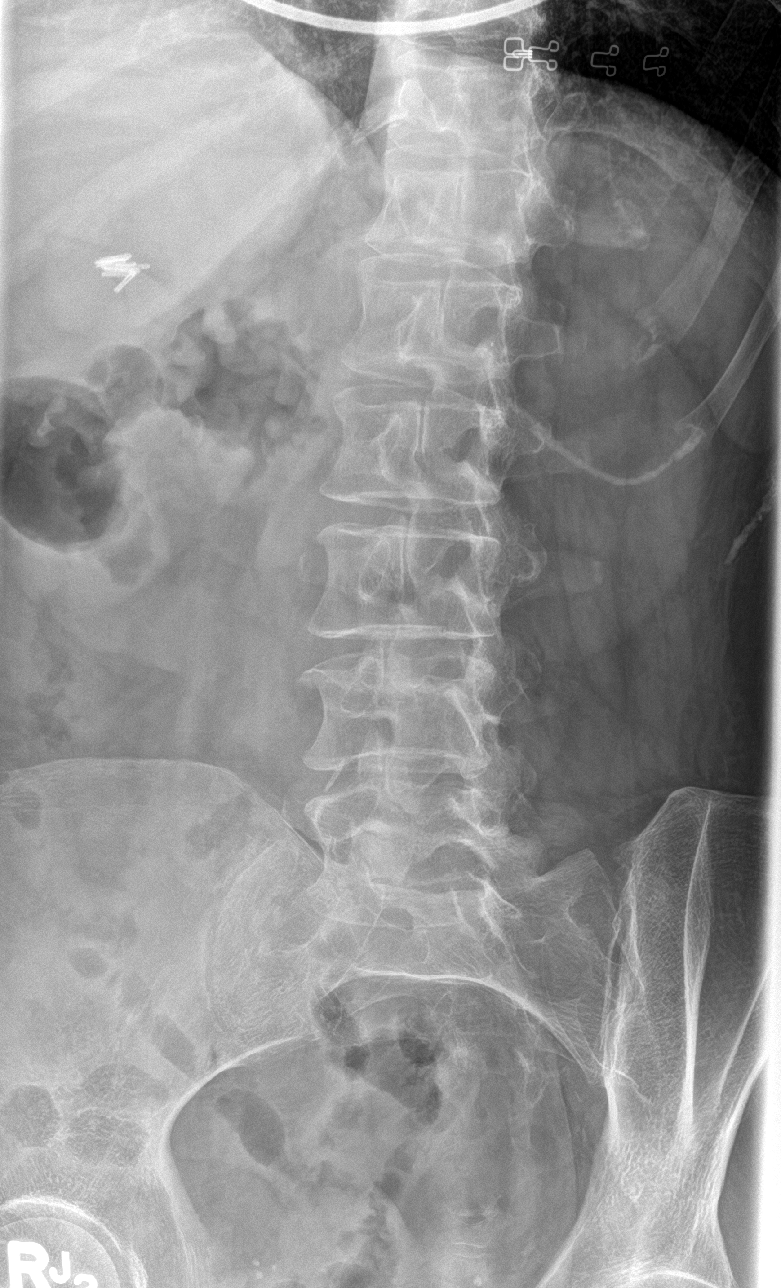

[l-spine obl (2 of 2)]
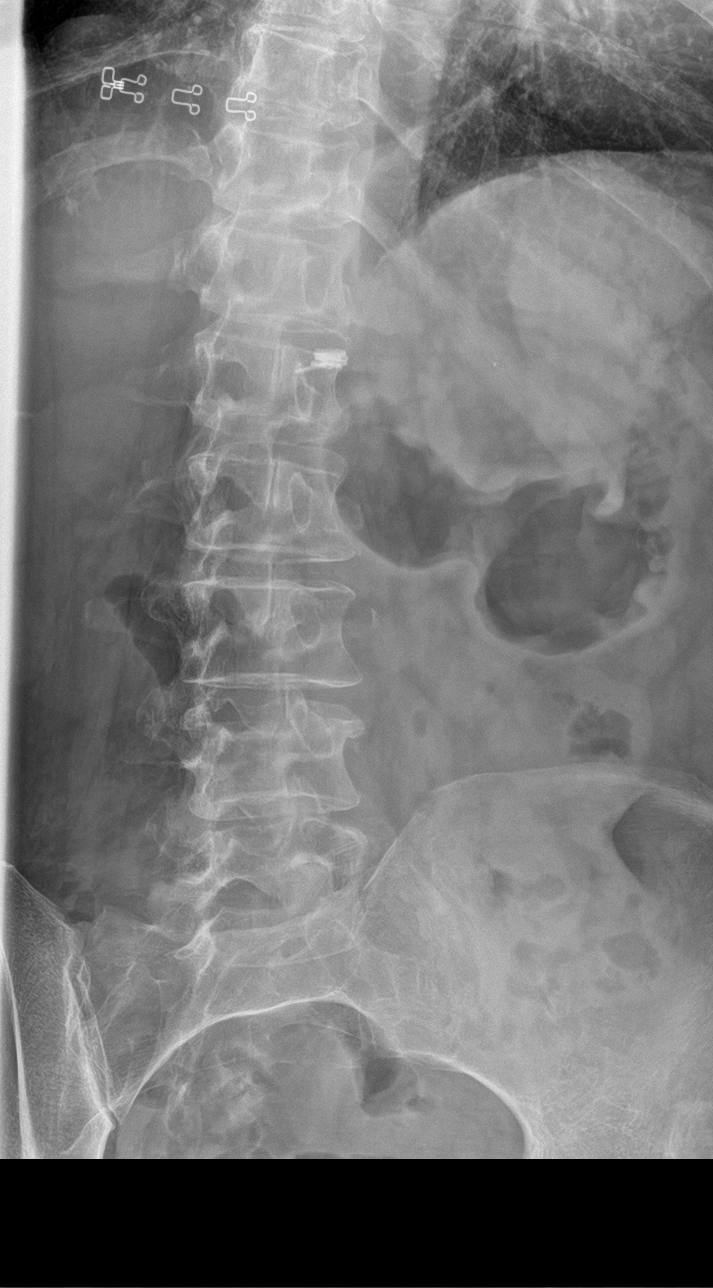

[l-spine lat]
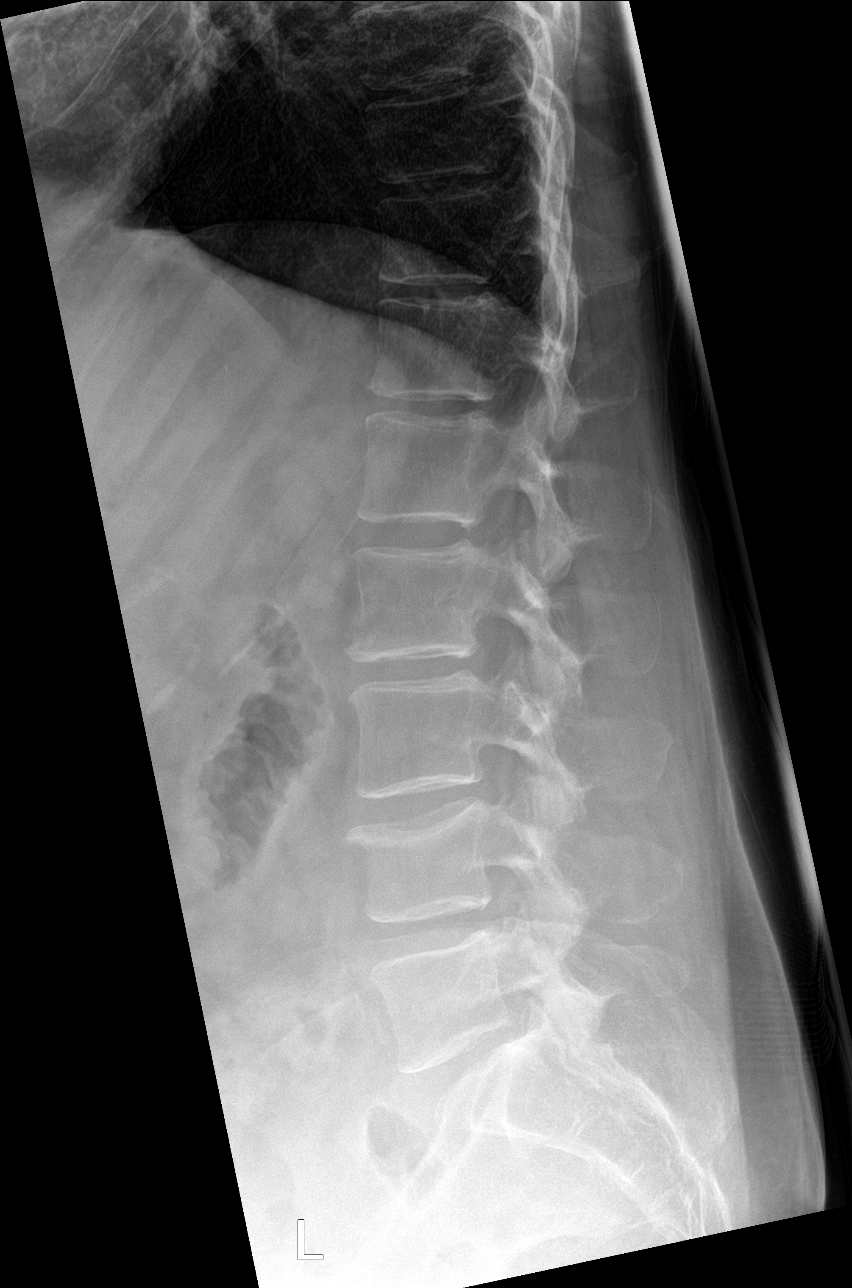

[l-spine spot]
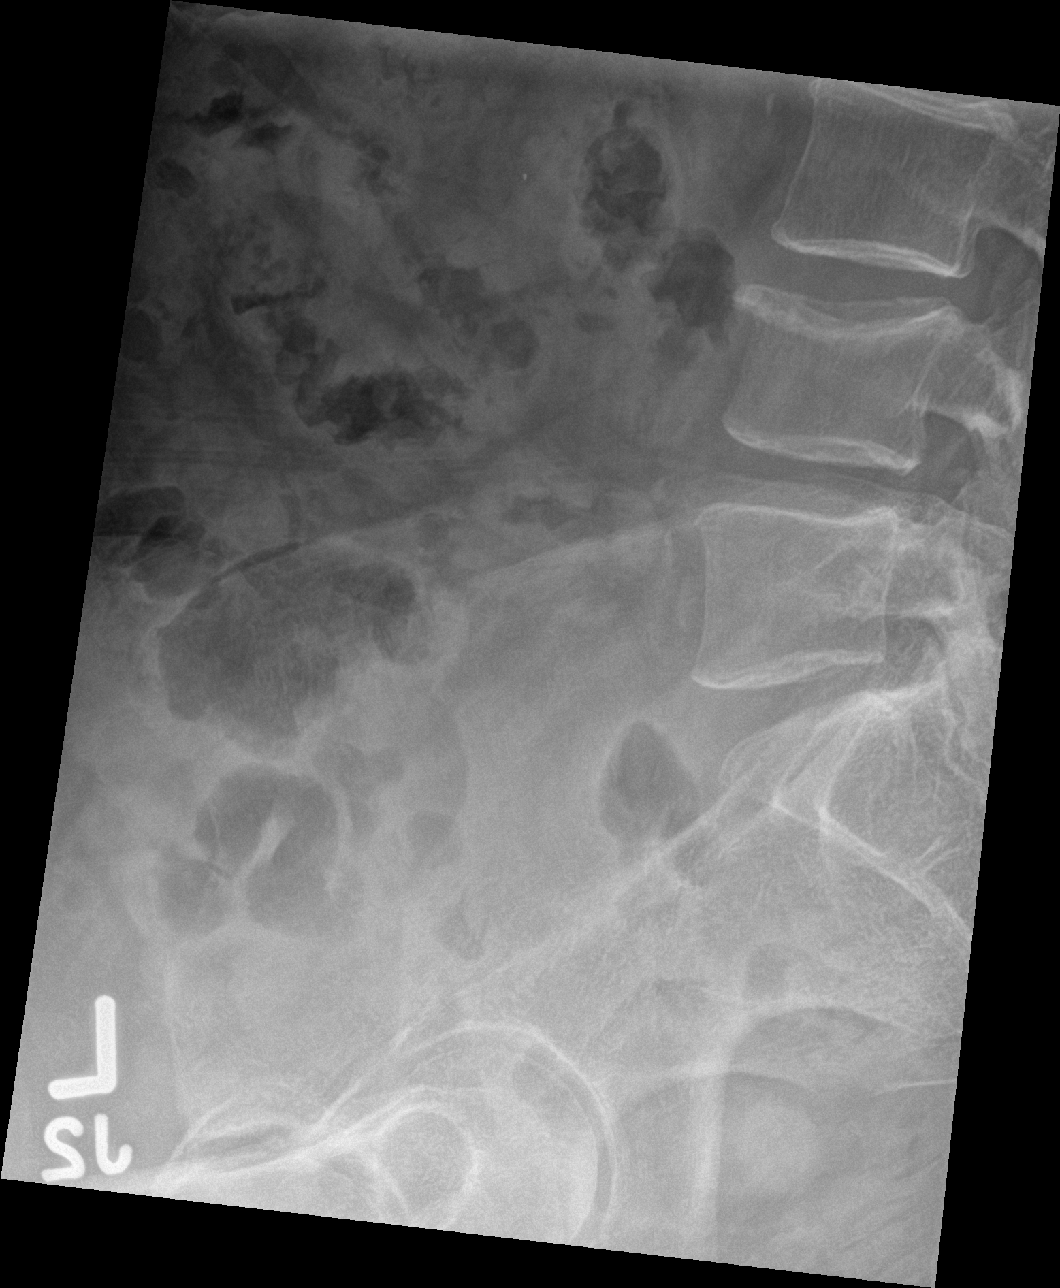

[l-spine ap]
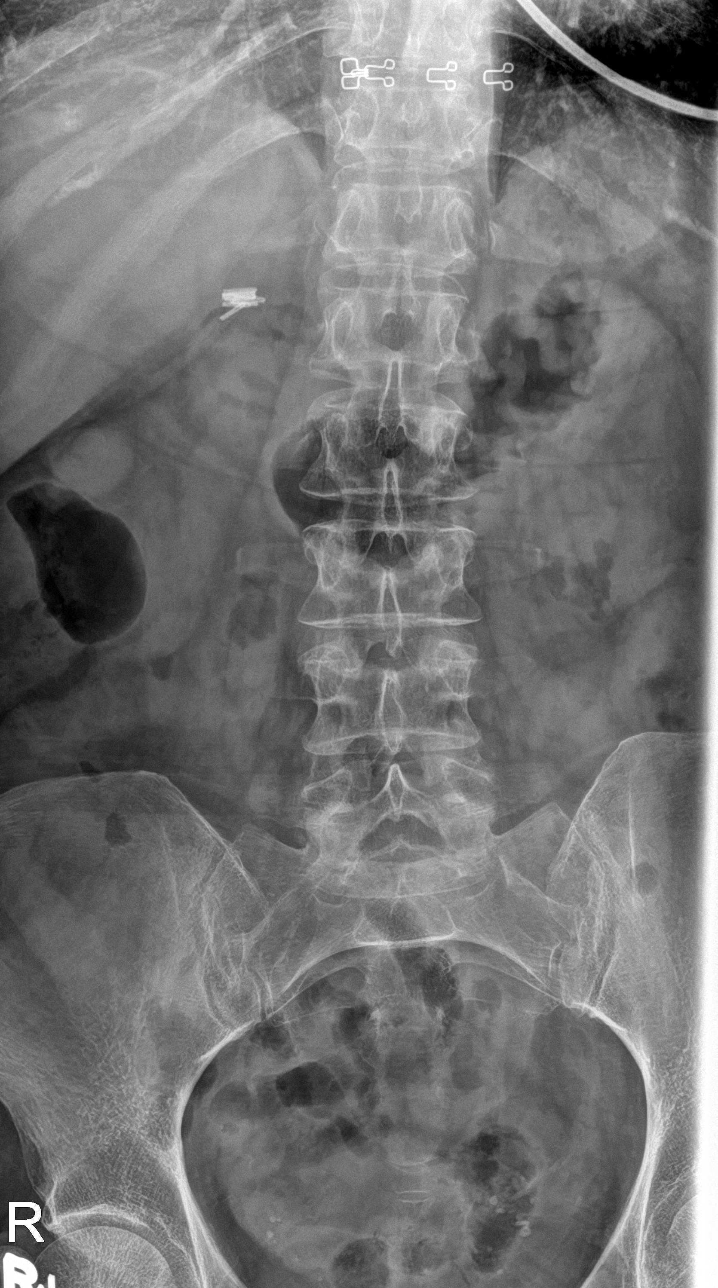

[5 of 5 positions shown; findings below may reference images not displayed]

FINDINGS: There is an acute slight compression fracture anterior superior
aspect of L4. No visible protrusion of bone into the spinal canal.
No bone destruction.

No disc space narrowing. Minimal degenerative changes of the facet
joints at L5-S1.
IMPRESSION: Benign-appearing slight compression fracture of the anterior
superior aspect of L4, new since 02/11/2014.

## 2017-06-16 DIAGNOSIS — R82998 Other abnormal findings in urine: Secondary | ICD-10-CM | POA: Diagnosis not present

## 2017-06-16 DIAGNOSIS — M810 Age-related osteoporosis without current pathological fracture: Secondary | ICD-10-CM | POA: Diagnosis not present

## 2017-06-16 DIAGNOSIS — E7849 Other hyperlipidemia: Secondary | ICD-10-CM | POA: Diagnosis not present

## 2017-06-16 DIAGNOSIS — Z0000 Encounter for general adult medical examination without abnormal findings: Secondary | ICD-10-CM | POA: Diagnosis not present

## 2017-06-19 ENCOUNTER — Other Ambulatory Visit: Payer: Self-pay | Admitting: Internal Medicine

## 2017-07-02 DIAGNOSIS — G8929 Other chronic pain: Secondary | ICD-10-CM | POA: Diagnosis not present

## 2017-07-02 DIAGNOSIS — J438 Other emphysema: Secondary | ICD-10-CM | POA: Diagnosis not present

## 2017-07-02 DIAGNOSIS — Z1389 Encounter for screening for other disorder: Secondary | ICD-10-CM | POA: Diagnosis not present

## 2017-07-02 DIAGNOSIS — E7849 Other hyperlipidemia: Secondary | ICD-10-CM | POA: Diagnosis not present

## 2017-07-02 DIAGNOSIS — R7301 Impaired fasting glucose: Secondary | ICD-10-CM | POA: Diagnosis not present

## 2017-07-02 DIAGNOSIS — Z0000 Encounter for general adult medical examination without abnormal findings: Secondary | ICD-10-CM | POA: Diagnosis not present

## 2017-10-14 ENCOUNTER — Other Ambulatory Visit: Payer: Self-pay | Admitting: Internal Medicine

## 2017-10-14 DIAGNOSIS — Z01419 Encounter for gynecological examination (general) (routine) without abnormal findings: Secondary | ICD-10-CM | POA: Diagnosis not present

## 2017-10-14 DIAGNOSIS — G8929 Other chronic pain: Secondary | ICD-10-CM | POA: Diagnosis not present

## 2017-10-14 DIAGNOSIS — Z1151 Encounter for screening for human papillomavirus (HPV): Secondary | ICD-10-CM | POA: Diagnosis not present

## 2017-10-14 DIAGNOSIS — N952 Postmenopausal atrophic vaginitis: Secondary | ICD-10-CM | POA: Diagnosis not present

## 2017-10-14 DIAGNOSIS — N644 Mastodynia: Secondary | ICD-10-CM | POA: Diagnosis not present

## 2017-10-14 DIAGNOSIS — Z23 Encounter for immunization: Secondary | ICD-10-CM | POA: Diagnosis not present

## 2017-10-14 DIAGNOSIS — N6310 Unspecified lump in the right breast, unspecified quadrant: Secondary | ICD-10-CM

## 2017-10-20 ENCOUNTER — Ambulatory Visit
Admission: RE | Admit: 2017-10-20 | Discharge: 2017-10-20 | Disposition: A | Discharge status: Home / Self Care | Payer: BLUE CROSS/BLUE SHIELD | Source: Ambulatory Visit | Attending: Internal Medicine | Admitting: Internal Medicine

## 2017-10-20 ENCOUNTER — Ambulatory Visit: Payer: BLUE CROSS/BLUE SHIELD

## 2017-10-20 DIAGNOSIS — R922 Inconclusive mammogram: Secondary | ICD-10-CM | POA: Diagnosis not present

## 2017-10-20 DIAGNOSIS — N6310 Unspecified lump in the right breast, unspecified quadrant: Secondary | ICD-10-CM

## 2017-12-24 DIAGNOSIS — R918 Other nonspecific abnormal finding of lung field: Secondary | ICD-10-CM | POA: Diagnosis not present

## 2017-12-24 DIAGNOSIS — J439 Emphysema, unspecified: Secondary | ICD-10-CM | POA: Diagnosis not present

## 2017-12-24 DIAGNOSIS — M4857XS Collapsed vertebra, not elsewhere classified, lumbosacral region, sequela of fracture: Secondary | ICD-10-CM | POA: Diagnosis not present

## 2017-12-24 DIAGNOSIS — R7301 Impaired fasting glucose: Secondary | ICD-10-CM | POA: Diagnosis not present

## 2017-12-24 DIAGNOSIS — G8929 Other chronic pain: Secondary | ICD-10-CM | POA: Diagnosis not present

## 2017-12-24 DIAGNOSIS — M810 Age-related osteoporosis without current pathological fracture: Secondary | ICD-10-CM | POA: Diagnosis not present

## 2017-12-25 ENCOUNTER — Other Ambulatory Visit: Payer: Self-pay | Admitting: Internal Medicine

## 2017-12-25 DIAGNOSIS — R918 Other nonspecific abnormal finding of lung field: Secondary | ICD-10-CM

## 2018-01-01 ENCOUNTER — Telehealth: Payer: Self-pay | Admitting: Pulmonary Disease

## 2018-01-01 NOTE — Telephone Encounter (Signed)
Called and spoke with pt's brother Merry Proud who stated pt is addicted to the Percocet and states she mixes this with alcohol. Merry Proud states when pt cannot get ahold of the percocet, she uses other narcotics to become addicted to it as well. Merry Proud said when pt mixes the narcotics with the alcohol, she is not a nice person and is ripping the family apart with all she has been doing.  Merry Proud wanted to know who has been filling meds for pt and I stated to Merry Proud that we have not seen pt in over a year so either way, we would not be filling her meds for her anyway due to that. Stated to him in pt's chart, it looks like the percocet has not been filled since 2015. After I said this, Merry Proud then asked where has pt been getting all the narcotics if Dr. Elsworth Soho has not been prescribing them for pt. I stated to Merry Proud that when pt has been saying what he thought was Dr. Elsworth Soho, she might have been saying her pcp's name of Dr. Dagmar Hait.  Merry Proud expressed understanding. I provided Merry Proud with the number of pcp. Nothing further needed.

## 2018-01-09 ENCOUNTER — Other Ambulatory Visit: Payer: Self-pay | Admitting: Internal Medicine

## 2018-01-19 ENCOUNTER — Ambulatory Visit
Admission: RE | Admit: 2018-01-19 | Discharge: 2018-01-19 | Disposition: A | Discharge status: Home / Self Care | Payer: BLUE CROSS/BLUE SHIELD | Source: Ambulatory Visit | Attending: Internal Medicine | Admitting: Internal Medicine

## 2018-01-19 DIAGNOSIS — J439 Emphysema, unspecified: Secondary | ICD-10-CM | POA: Diagnosis not present

## 2018-01-19 DIAGNOSIS — R918 Other nonspecific abnormal finding of lung field: Secondary | ICD-10-CM

## 2018-01-29 ENCOUNTER — Other Ambulatory Visit: Payer: Self-pay | Admitting: Internal Medicine

## 2018-01-29 DIAGNOSIS — R918 Other nonspecific abnormal finding of lung field: Secondary | ICD-10-CM

## 2018-01-30 DIAGNOSIS — M545 Low back pain: Secondary | ICD-10-CM | POA: Diagnosis not present

## 2018-01-30 DIAGNOSIS — M546 Pain in thoracic spine: Secondary | ICD-10-CM | POA: Diagnosis not present

## 2018-01-30 DIAGNOSIS — Z6823 Body mass index (BMI) 23.0-23.9, adult: Secondary | ICD-10-CM | POA: Diagnosis not present

## 2018-03-27 ENCOUNTER — Other Ambulatory Visit: Payer: Self-pay | Admitting: Internal Medicine

## 2018-05-14 ENCOUNTER — Ambulatory Visit: Payer: BLUE CROSS/BLUE SHIELD | Admitting: Internal Medicine

## 2018-05-14 ENCOUNTER — Other Ambulatory Visit: Payer: Self-pay

## 2018-05-14 ENCOUNTER — Encounter: Payer: Self-pay | Admitting: Internal Medicine

## 2018-05-14 ENCOUNTER — Ambulatory Visit (INDEPENDENT_AMBULATORY_CARE_PROVIDER_SITE_OTHER): Payer: BLUE CROSS/BLUE SHIELD | Admitting: Internal Medicine

## 2018-05-14 DIAGNOSIS — K219 Gastro-esophageal reflux disease without esophagitis: Secondary | ICD-10-CM | POA: Diagnosis not present

## 2018-05-14 MED ORDER — PROMETHAZINE HCL 25 MG PO TABS: 25.0000 mg | ORAL_TABLET | Freq: Four times a day (QID) | ORAL | 1 refills | Status: DC | PRN

## 2018-05-14 NOTE — Progress Notes (Signed)
    TELEHEALTH ENCOUNTER IN SETTING OF COVID-19 PANDEMIC - REQUESTED BY PATIENT SERVICE PROVIDED BY TELEMEDECINE - TYPE: phone PATIENT LOCATION: home PATIENT HAS CONSENTED TO TELEHEALTH VISIT PROVIDER LOCATION: OFFICE PARTICIPANTS OTHER THAN PATIENT:none TIME SPENT ON CALL: 5 mins    Elizabeth Cox 63 y.o. October 25, 1955 [id number]  Assessment & Plan:   Esophageal reflux OK to refill PPI x 1-2 years She will have pharmacy contact when next refill needed Prn promethazine 25 mg #30 1 RF     Subjective:   Chief Complaint: GERD f/u  HPI Doing well on bid PPI - occasionally qd. When has a flare may use some carafate. Asking for promethazine Rx to have on hand when severely nauseous No dysphagia, weight loss or bleeding Allergies  Allergen Reactions  . Morphine And Related Nausea And Vomiting  . Vicodin [Hydrocodone-Acetaminophen] Nausea And Vomiting   Current Meds  Medication Sig  . CALCIUM PO Take 1 tablet by mouth daily.  Marland Kitchen FLUoxetine (PROZAC) 20 MG capsule Take 1 capsule by mouth daily.  Marland Kitchen oxyCODONE-acetaminophen (PERCOCET) 10-325 MG per tablet Take 1 tablet by mouth as needed for pain. (Patient taking differently: Take 1 tablet by mouth 3 (three) times daily. )  . pantoprazole (PROTONIX) 40 MG tablet TAKE 1 TABLET BY MOUTH TWICE DAILY  . Vitamin D, Ergocalciferol, (DRISDOL) 1.25 MG (50000 UT) CAPS capsule Take 1 capsule by mouth once a week.    PMH/PSH/SHx and FHX reviewed in EMR     Review of Systems As above  Wt Readings from Last 3 Encounters:  05/14/18 135 lb (61.2 kg)  10/03/16 142 lb 9.6 oz (64.7 kg)  06/13/16 148 lb (67.1 kg)

## 2018-05-14 NOTE — Assessment & Plan Note (Signed)
OK to refill PPI x 1-2 years She will have pharmacy contact when next refill needed Prn promethazine 25 mg #30 1 RF

## 2018-05-14 NOTE — Patient Instructions (Signed)
Good to talk to you today.  I sent a promethazine prescription in to pharmacy. Have the pharmacy contact me for refill of pantoprazole when needed.  See me within 2 years - sooner if having problems  I appreciate the opportunity to care for you. Gatha Mayer, MD, [facility]

## 2018-06-19 ENCOUNTER — Other Ambulatory Visit: Payer: Self-pay | Admitting: Internal Medicine

## 2018-07-20 DIAGNOSIS — M546 Pain in thoracic spine: Secondary | ICD-10-CM | POA: Diagnosis not present

## 2018-07-20 DIAGNOSIS — R7301 Impaired fasting glucose: Secondary | ICD-10-CM | POA: Diagnosis not present

## 2018-07-20 DIAGNOSIS — M545 Low back pain: Secondary | ICD-10-CM | POA: Diagnosis not present

## 2018-07-20 DIAGNOSIS — Z0000 Encounter for general adult medical examination without abnormal findings: Secondary | ICD-10-CM | POA: Diagnosis not present

## 2018-07-20 DIAGNOSIS — Z6823 Body mass index (BMI) 23.0-23.9, adult: Secondary | ICD-10-CM | POA: Diagnosis not present

## 2018-07-20 DIAGNOSIS — E559 Vitamin D deficiency, unspecified: Secondary | ICD-10-CM | POA: Diagnosis not present

## 2018-07-22 ENCOUNTER — Other Ambulatory Visit: Payer: BLUE CROSS/BLUE SHIELD

## 2018-07-22 DIAGNOSIS — Z0000 Encounter for general adult medical examination without abnormal findings: Secondary | ICD-10-CM | POA: Diagnosis not present

## 2018-07-22 DIAGNOSIS — E785 Hyperlipidemia, unspecified: Secondary | ICD-10-CM | POA: Diagnosis not present

## 2018-07-22 DIAGNOSIS — Z1331 Encounter for screening for depression: Secondary | ICD-10-CM | POA: Diagnosis not present

## 2018-07-22 DIAGNOSIS — K219 Gastro-esophageal reflux disease without esophagitis: Secondary | ICD-10-CM | POA: Diagnosis not present

## 2018-07-22 DIAGNOSIS — K635 Polyp of colon: Secondary | ICD-10-CM | POA: Diagnosis not present

## 2018-07-22 DIAGNOSIS — R7301 Impaired fasting glucose: Secondary | ICD-10-CM | POA: Diagnosis not present

## 2018-10-23 DIAGNOSIS — R918 Other nonspecific abnormal finding of lung field: Secondary | ICD-10-CM | POA: Diagnosis not present

## 2019-06-24 ENCOUNTER — Other Ambulatory Visit: Payer: Self-pay | Admitting: Internal Medicine

## 2019-11-25 ENCOUNTER — Other Ambulatory Visit: Payer: Self-pay | Admitting: Internal Medicine

## 2019-11-25 DIAGNOSIS — Z1231 Encounter for screening mammogram for malignant neoplasm of breast: Secondary | ICD-10-CM

## 2020-01-21 ENCOUNTER — Ambulatory Visit: Payer: BLUE CROSS/BLUE SHIELD

## 2020-04-04 DIAGNOSIS — K7469 Other cirrhosis of liver: Secondary | ICD-10-CM | POA: Insufficient documentation

## 2020-08-01 DIAGNOSIS — D638 Anemia in other chronic diseases classified elsewhere: Secondary | ICD-10-CM | POA: Insufficient documentation

## 2020-08-03 ENCOUNTER — Other Ambulatory Visit: Payer: Self-pay | Admitting: Internal Medicine

## 2020-08-04 ENCOUNTER — Other Ambulatory Visit: Payer: Self-pay | Admitting: Internal Medicine

## 2020-09-21 ENCOUNTER — Other Ambulatory Visit: Payer: Self-pay | Admitting: Internal Medicine

## 2021-01-23 DIAGNOSIS — F1010 Alcohol abuse, uncomplicated: Secondary | ICD-10-CM | POA: Insufficient documentation

## 2021-03-16 DIAGNOSIS — G9340 Encephalopathy, unspecified: Secondary | ICD-10-CM | POA: Diagnosis present

## 2021-03-18 DIAGNOSIS — K7030 Alcoholic cirrhosis of liver without ascites: Secondary | ICD-10-CM | POA: Diagnosis present

## 2021-12-13 DIAGNOSIS — E441 Mild protein-calorie malnutrition: Secondary | ICD-10-CM | POA: Insufficient documentation

## 2022-01-14 ENCOUNTER — Other Ambulatory Visit: Payer: Self-pay

## 2022-01-14 ENCOUNTER — Inpatient Hospital Stay (HOSPITAL_COMMUNITY): Payer: BLUE CROSS/BLUE SHIELD

## 2022-01-14 ENCOUNTER — Inpatient Hospital Stay (HOSPITAL_COMMUNITY)
Admission: EM | Admit: 2022-01-14 | Discharge: 2022-01-21 | DRG: 441 | Disposition: A | Discharge status: Skilled Nursing Facility | Payer: BLUE CROSS/BLUE SHIELD | Attending: Internal Medicine | Admitting: Internal Medicine

## 2022-01-14 ENCOUNTER — Emergency Department (HOSPITAL_COMMUNITY): Payer: BLUE CROSS/BLUE SHIELD

## 2022-01-14 ENCOUNTER — Encounter (HOSPITAL_COMMUNITY): Payer: Self-pay | Admitting: Internal Medicine

## 2022-01-14 DIAGNOSIS — K7682 Hepatic encephalopathy: Principal | ICD-10-CM | POA: Diagnosis present

## 2022-01-14 DIAGNOSIS — N390 Urinary tract infection, site not specified: Secondary | ICD-10-CM | POA: Diagnosis present

## 2022-01-14 DIAGNOSIS — R7989 Other specified abnormal findings of blood chemistry: Secondary | ICD-10-CM | POA: Diagnosis present

## 2022-01-14 DIAGNOSIS — E8809 Other disorders of plasma-protein metabolism, not elsewhere classified: Secondary | ICD-10-CM | POA: Diagnosis present

## 2022-01-14 DIAGNOSIS — G9341 Metabolic encephalopathy: Secondary | ICD-10-CM | POA: Diagnosis present

## 2022-01-14 DIAGNOSIS — N179 Acute kidney failure, unspecified: Secondary | ICD-10-CM | POA: Diagnosis present

## 2022-01-14 DIAGNOSIS — K7031 Alcoholic cirrhosis of liver with ascites: Secondary | ICD-10-CM | POA: Diagnosis present

## 2022-01-14 DIAGNOSIS — Z800 Family history of malignant neoplasm of digestive organs: Secondary | ICD-10-CM

## 2022-01-14 DIAGNOSIS — Z8249 Family history of ischemic heart disease and other diseases of the circulatory system: Secondary | ICD-10-CM

## 2022-01-14 DIAGNOSIS — E876 Hypokalemia: Secondary | ICD-10-CM | POA: Diagnosis not present

## 2022-01-14 DIAGNOSIS — R7401 Elevation of levels of liver transaminase levels: Secondary | ICD-10-CM

## 2022-01-14 DIAGNOSIS — K219 Gastro-esophageal reflux disease without esophagitis: Secondary | ICD-10-CM | POA: Diagnosis present

## 2022-01-14 DIAGNOSIS — Z91199 Patient's noncompliance with other medical treatment and regimen due to unspecified reason: Secondary | ICD-10-CM

## 2022-01-14 DIAGNOSIS — D6959 Other secondary thrombocytopenia: Secondary | ICD-10-CM | POA: Diagnosis present

## 2022-01-14 DIAGNOSIS — E872 Acidosis, unspecified: Secondary | ICD-10-CM | POA: Diagnosis present

## 2022-01-14 DIAGNOSIS — K7030 Alcoholic cirrhosis of liver without ascites: Secondary | ICD-10-CM | POA: Diagnosis present

## 2022-01-14 DIAGNOSIS — E861 Hypovolemia: Secondary | ICD-10-CM | POA: Diagnosis present

## 2022-01-14 DIAGNOSIS — Z885 Allergy status to narcotic agent status: Secondary | ICD-10-CM | POA: Diagnosis not present

## 2022-01-14 DIAGNOSIS — D72829 Elevated white blood cell count, unspecified: Secondary | ICD-10-CM | POA: Diagnosis not present

## 2022-01-14 DIAGNOSIS — Z825 Family history of asthma and other chronic lower respiratory diseases: Secondary | ICD-10-CM

## 2022-01-14 DIAGNOSIS — E43 Unspecified severe protein-calorie malnutrition: Secondary | ICD-10-CM | POA: Diagnosis present

## 2022-01-14 DIAGNOSIS — Z720 Tobacco use: Secondary | ICD-10-CM | POA: Diagnosis not present

## 2022-01-14 DIAGNOSIS — N19 Unspecified kidney failure: Secondary | ICD-10-CM | POA: Diagnosis present

## 2022-01-14 DIAGNOSIS — E871 Hypo-osmolality and hyponatremia: Secondary | ICD-10-CM | POA: Diagnosis present

## 2022-01-14 DIAGNOSIS — Z803 Family history of malignant neoplasm of breast: Secondary | ICD-10-CM

## 2022-01-14 DIAGNOSIS — G9340 Encephalopathy, unspecified: Secondary | ICD-10-CM | POA: Diagnosis present

## 2022-01-14 DIAGNOSIS — J432 Centrilobular emphysema: Secondary | ICD-10-CM | POA: Diagnosis present

## 2022-01-14 DIAGNOSIS — E860 Dehydration: Secondary | ICD-10-CM | POA: Diagnosis present

## 2022-01-14 DIAGNOSIS — Z833 Family history of diabetes mellitus: Secondary | ICD-10-CM

## 2022-01-14 DIAGNOSIS — R6510 Systemic inflammatory response syndrome (SIRS) of non-infectious origin without acute organ dysfunction: Secondary | ICD-10-CM | POA: Diagnosis not present

## 2022-01-14 DIAGNOSIS — R1013 Epigastric pain: Secondary | ICD-10-CM

## 2022-01-14 DIAGNOSIS — I340 Nonrheumatic mitral (valve) insufficiency: Secondary | ICD-10-CM | POA: Diagnosis not present

## 2022-01-14 DIAGNOSIS — G894 Chronic pain syndrome: Secondary | ICD-10-CM | POA: Diagnosis present

## 2022-01-14 DIAGNOSIS — N3000 Acute cystitis without hematuria: Secondary | ICD-10-CM | POA: Diagnosis not present

## 2022-01-14 DIAGNOSIS — F17210 Nicotine dependence, cigarettes, uncomplicated: Secondary | ICD-10-CM | POA: Diagnosis present

## 2022-01-14 DIAGNOSIS — Z79899 Other long term (current) drug therapy: Secondary | ICD-10-CM

## 2022-01-14 DIAGNOSIS — D638 Anemia in other chronic diseases classified elsewhere: Secondary | ICD-10-CM | POA: Diagnosis present

## 2022-01-14 DIAGNOSIS — E875 Hyperkalemia: Secondary | ICD-10-CM | POA: Diagnosis present

## 2022-01-14 DIAGNOSIS — Z681 Body mass index (BMI) 19 or less, adult: Secondary | ICD-10-CM | POA: Diagnosis not present

## 2022-01-14 DIAGNOSIS — F1010 Alcohol abuse, uncomplicated: Secondary | ICD-10-CM | POA: Diagnosis present

## 2022-01-14 DIAGNOSIS — J449 Chronic obstructive pulmonary disease, unspecified: Secondary | ICD-10-CM | POA: Diagnosis present

## 2022-01-14 DIAGNOSIS — D696 Thrombocytopenia, unspecified: Secondary | ICD-10-CM | POA: Diagnosis not present

## 2022-01-14 DIAGNOSIS — D649 Anemia, unspecified: Secondary | ICD-10-CM | POA: Diagnosis present

## 2022-01-14 HISTORY — DX: Alcoholic cirrhosis of liver without ascites: K70.30

## 2022-01-14 HISTORY — DX: Hepatic encephalopathy: K76.82

## 2022-01-14 LAB — CBC
HCT: 24.6 % — ABNORMAL LOW (ref 36.0–46.0)
Hemoglobin: 8 g/dL — ABNORMAL LOW (ref 12.0–15.0)
MCH: 28.6 pg (ref 26.0–34.0)
MCHC: 32.5 g/dL (ref 30.0–36.0)
MCV: 87.9 fL (ref 80.0–100.0)
Platelets: 111 10*3/uL — ABNORMAL LOW (ref 150–400)
RBC: 2.8 MIL/uL — ABNORMAL LOW (ref 3.87–5.11)
RDW: 17.8 % — ABNORMAL HIGH (ref 11.5–15.5)
WBC: 24.1 10*3/uL — ABNORMAL HIGH (ref 4.0–10.5)
nRBC: 0.3 % — ABNORMAL HIGH (ref 0.0–0.2)

## 2022-01-14 LAB — COMPREHENSIVE METABOLIC PANEL
ALT: 38 U/L (ref 0–44)
AST: 106 U/L — ABNORMAL HIGH (ref 15–41)
Albumin: 1.9 g/dL — ABNORMAL LOW (ref 3.5–5.0)
Alkaline Phosphatase: 232 U/L — ABNORMAL HIGH (ref 38–126)
Anion gap: 12 (ref 5–15)
BUN: 40 mg/dL — ABNORMAL HIGH (ref 8–23)
CO2: 20 mmol/L — ABNORMAL LOW (ref 22–32)
Calcium: 7.7 mg/dL — ABNORMAL LOW (ref 8.9–10.3)
Chloride: 101 mmol/L (ref 98–111)
Creatinine, Ser: 1.25 mg/dL — ABNORMAL HIGH (ref 0.44–1.00)
GFR, Estimated: 48 mL/min — ABNORMAL LOW (ref >60)
Glucose, Bld: 47 mg/dL — ABNORMAL LOW (ref 70–99)
Potassium: 5.2 mmol/L — ABNORMAL HIGH (ref 3.5–5.1)
Sodium: 133 mmol/L — ABNORMAL LOW (ref 135–145)
Total Bilirubin: 3 mg/dL — ABNORMAL HIGH (ref 0.3–1.2)
Total Protein: 5.3 g/dL — ABNORMAL LOW (ref 6.5–8.1)

## 2022-01-14 LAB — URINALYSIS, ROUTINE W REFLEX MICROSCOPIC
Bilirubin Urine: NEGATIVE
Glucose, UA: NEGATIVE mg/dL
Hgb urine dipstick: NEGATIVE
Ketones, ur: NEGATIVE mg/dL
Nitrite: NEGATIVE
Protein, ur: NEGATIVE mg/dL
Specific Gravity, Urine: 1.013 (ref 1.005–1.030)
pH: 5 (ref 5.0–8.0)

## 2022-01-14 LAB — ABO/RH: ABO/RH(D): O NEG

## 2022-01-14 LAB — MAGNESIUM: Magnesium: 1.9 mg/dL (ref 1.7–2.4)

## 2022-01-14 LAB — TROPONIN I (HIGH SENSITIVITY)
Troponin I (High Sensitivity): 46 ng/L — ABNORMAL HIGH (ref <18)
Troponin I (High Sensitivity): 39 ng/L — ABNORMAL HIGH (ref <18)

## 2022-01-14 LAB — PROCALCITONIN: Procalcitonin: 1.32 ng/mL

## 2022-01-14 LAB — IRON AND TIBC
Iron: 29 ug/dL (ref 28–170)
Saturation Ratios: 15 % (ref 10.4–31.8)
TIBC: 193 ug/dL — ABNORMAL LOW (ref 250–450)
UIBC: 164 ug/dL

## 2022-01-14 LAB — RETICULOCYTES
Immature Retic Fract: 22.4 % — ABNORMAL HIGH (ref 2.3–15.9)
RBC.: 2.79 MIL/uL — ABNORMAL LOW (ref 3.87–5.11)
Retic Count, Absolute: 117.7 10*3/uL (ref 19.0–186.0)
Retic Ct Pct: 4.2 % — ABNORMAL HIGH (ref 0.4–3.1)

## 2022-01-14 LAB — I-STAT CHEM 8, ED
BUN: 46 mg/dL — ABNORMAL HIGH (ref 8–23)
Calcium, Ion: 1.02 mmol/L — ABNORMAL LOW (ref 1.15–1.40)
Chloride: 101 mmol/L (ref 98–111)
Creatinine, Ser: 1.2 mg/dL — ABNORMAL HIGH (ref 0.44–1.00)
Glucose, Bld: 102 mg/dL — ABNORMAL HIGH (ref 70–99)
HCT: 25 % — ABNORMAL LOW (ref 36.0–46.0)
Hemoglobin: 8.5 g/dL — ABNORMAL LOW (ref 12.0–15.0)
Potassium: 3.7 mmol/L (ref 3.5–5.1)
Sodium: 139 mmol/L (ref 135–145)
TCO2: 26 mmol/L (ref 22–32)

## 2022-01-14 LAB — CK: Total CK: 55 U/L (ref 38–234)

## 2022-01-14 LAB — PROTIME-INR
INR: 1.9 — ABNORMAL HIGH (ref 0.8–1.2)
Prothrombin Time: 21.5 seconds — ABNORMAL HIGH (ref 11.4–15.2)

## 2022-01-14 LAB — RAPID URINE DRUG SCREEN, HOSP PERFORMED
Amphetamines: NOT DETECTED
Barbiturates: NOT DETECTED
Benzodiazepines: NOT DETECTED
Cocaine: NOT DETECTED
Opiates: NOT DETECTED
Tetrahydrocannabinol: POSITIVE — AB

## 2022-01-14 LAB — AMMONIA: Ammonia: 50 umol/L — ABNORMAL HIGH (ref 9–35)

## 2022-01-14 LAB — LACTIC ACID, PLASMA
Lactic Acid, Venous: 2 mmol/L (ref 0.5–1.9)
Lactic Acid, Venous: 1.6 mmol/L (ref 0.5–1.9)

## 2022-01-14 LAB — TSH: TSH: 1.2 u[IU]/mL (ref 0.350–4.500)

## 2022-01-14 LAB — VITAMIN B12: Vitamin B-12: 743 pg/mL (ref 180–914)

## 2022-01-14 LAB — FERRITIN: Ferritin: 77 ng/mL (ref 11–307)

## 2022-01-14 LAB — FOLATE: Folate: 19.3 ng/mL (ref >5.9)

## 2022-01-14 LAB — CBG MONITORING, ED: Glucose-Capillary: 96 mg/dL (ref 70–99)

## 2022-01-14 MED ORDER — NICOTINE 21 MG/24HR TD PT24: 21.0000 mg | MEDICATED_PATCH | Freq: Every day | TRANSDERMAL | Status: DC | PRN

## 2022-01-14 MED ORDER — ACETAMINOPHEN 650 MG RE SUPP: 650.0000 mg | Freq: Four times a day (QID) | RECTAL | Status: DC | PRN

## 2022-01-14 MED ORDER — ALBUTEROL SULFATE (2.5 MG/3ML) 0.083% IN NEBU: 2.5000 mg | INHALATION_SOLUTION | RESPIRATORY_TRACT | Status: DC | PRN

## 2022-01-14 MED ORDER — DEXTROSE 50 % IV SOLN: 1.0000 | INTRAVENOUS | Status: DC | PRN

## 2022-01-14 MED ORDER — PANTOPRAZOLE SODIUM 40 MG IV SOLR
40.0000 mg | Freq: Two times a day (BID) | INTRAVENOUS | Status: DC
  Administered 2022-01-14 – 2022-01-16 (×5): 40 mg via INTRAVENOUS
  Filled 2022-01-14 (×5): qty 10

## 2022-01-14 MED ORDER — LACTULOSE 10 GM/15ML PO SOLN
30.0000 g | Freq: Three times a day (TID) | ORAL | Status: DC
  Administered 2022-01-15 – 2022-01-21 (×17): 30 g via ORAL
  Filled 2022-01-14: qty 60
  Filled 2022-01-14: qty 45
  Filled 2022-01-14 (×5): qty 60
  Filled 2022-01-14: qty 45
  Filled 2022-01-14 (×2): qty 60
  Filled 2022-01-14: qty 45
  Filled 2022-01-14 (×8): qty 60

## 2022-01-14 MED ORDER — ONDANSETRON HCL 4 MG/2ML IJ SOLN: 4.0000 mg | Freq: Four times a day (QID) | INTRAMUSCULAR | Status: DC | PRN

## 2022-01-14 MED ORDER — LACTATED RINGERS IV SOLN: INTRAVENOUS | Status: AC

## 2022-01-14 MED ORDER — ACETAMINOPHEN 325 MG PO TABS
650.0000 mg | ORAL_TABLET | Freq: Four times a day (QID) | ORAL | Status: DC | PRN
  Administered 2022-01-16: 650 mg via ORAL
  Filled 2022-01-14: qty 2

## 2022-01-14 MED ORDER — LACTATED RINGERS IV BOLUS
500.0000 mL | Freq: Once | INTRAVENOUS | Status: AC
  Administered 2022-01-14: 500 mL via INTRAVENOUS

## 2022-01-14 MED ORDER — LACTATED RINGERS IV BOLUS
1000.0000 mL | Freq: Once | INTRAVENOUS | Status: AC
  Administered 2022-01-14: 1000 mL via INTRAVENOUS

## 2022-01-14 MED ORDER — LACTULOSE 10 GM/15ML PO SOLN
10.0000 g | Freq: Once | ORAL | Status: AC
  Administered 2022-01-14: 10 g via ORAL
  Filled 2022-01-14: qty 30

## 2022-01-14 MED ORDER — NALOXONE HCL 0.4 MG/ML IJ SOLN: 0.4000 mg | INTRAMUSCULAR | Status: DC | PRN

## 2022-01-14 NOTE — ED Triage Notes (Signed)
PT BIB EMS DUE TO AMS from home. Pt lives with daughter and came saturated in urine, unknown how long pt has been laying in bed. Pt has hx of high ammonia levels. PT comes altered and moaning. VSS.

## 2022-01-14 NOTE — ED Notes (Signed)
Pt is a very hard stick. IV team attempted 3 times. PA at bedside got a 20G in left upper arm via [place]. 1st set of labs sent at 1816.

## 2022-01-14 NOTE — ED Provider Notes (Signed)
Ultrasound ED Peripheral IV (Provider)  Date/Time: 01/14/2022 7:07 PM  Performed by: Tedd Sias, PA Authorized by: Tedd Sias, PA   Procedure details:    Indications: multiple failed IV attempts     Skin Prep: chlorhexidine gluconate     Location:  Left AC   Angiocath:  20 G   Bedside Ultrasound Guided: Yes     Images: not archived     Patient tolerated procedure without complications: Yes     Dressing applied: Yes       Tedd Sias, PA 01/14/22 1908    Audley Hose, MD 01/14/22 2039

## 2022-01-14 NOTE — Progress Notes (Signed)
Dear Doctor: Elizabeth Cox,  This patient has been identified as a candidate for PICC for the following reason (s): poor veins/poor circulatory system (CHF, COPD, emphysema, diabetes, steroid use, IV drug abuse, etc.) If you agree, please write an order for the indicated device. For any questions contact the Vascular Access Team at [phone] if no answer, please leave a message.  Thank you for supporting the early vascular access assessment program.

## 2022-01-14 NOTE — ED Notes (Signed)
IV team at bedside 

## 2022-01-14 NOTE — ED Notes (Signed)
CONNIE  EM[facility] [place]

## 2022-01-14 NOTE — ED Provider Notes (Signed)
[facility] EMERGENCY DEPARTMENT Provider Note   CSN: [id number] Arrival date & time: 01/14/22  1428     History  Chief Complaint  Patient presents with   Altered Mental Status    Elizabeth Cox is a 67 y.o. female with EtOH cirrhosis w/ ascites s/p TIPS, h/o hepatic encephalopathy, SMV thrombus on Eliquis, alcohol abuse, centrilobular emphysema, s/p cholecystectomy, reflux, malnutrition, B12/folate deficiencies, and noncompliance w/ medical treatment who presents with AMS.   Presents with AMS. EMS notes patient is saturated in urine, unknown how long pt has been laying in bed. Patient presents arousable to voice bur drowsy appearing, lying in bed with knees drawn to chest. She is moaning, only intermittently following commands and intermittently answering questions.  She endorses yes she is in pain but does not state where.  Per chart review of recent hospitalization at [facility], patient was recently admitted from 12/26/2021 to 01/02/2022 for altered mental status found to have hepatic encephalopathy and an SMV thrombus started on heparin drip and transition to oral anticoagulation.  "Patient unfortunately lives alone and would have episodes where she would go to sleep and forget to take her medication for days on end and her ammonia level would continue to rise."  Also had positive fecal occult at that time and had an upper endoscopy where she was found to have severe oozing portal hypertensive gastropathy in the fundus, recommended to continue PPI and was cleared by GI to resume her Eliquis.  Patient's contact in the chart is her father but the number does not work.   Altered Mental Status      Home Medications Prior to Admission medications   Medication Sig Start Date End Date Taking? Authorizing Provider  CALCIUM PO Take 1 tablet by mouth daily.    [provider]  FLUoxetine (PROZAC) 20 MG capsule Take 1 capsule by mouth daily. 02/23/18   [provider]  oxyCODONE-acetaminophen (PERCOCET) 10-325 MG per tablet Take 1 tablet by mouth as needed for pain. Patient taking differently: Take 1 tablet by mouth 3 (three) times daily.  03/17/13   Gatha Mayer, MD  pantoprazole (PROTONIX) 40 MG tablet TAKE 1 TABLET BY MOUTH TWICE DAILY 08/04/20   Gatha Mayer, MD  promethazine (PHENERGAN) 25 MG tablet Take 1 tablet (25 mg total) by mouth every 6 (six) hours as needed for nausea or vomiting. 05/14/18   Gatha Mayer, MD  sucralfate (CARAFATE) 1 g tablet Take 1 tablet (1 g total) by mouth 4 (four) times daily -  with meals and at bedtime. Patient not taking: Reported on 05/14/2018 08/28/15   Davonna Belling, MD  Vitamin D, Ergocalciferol, (DRISDOL) 1.25 MG (50000 UT) CAPS capsule Take 1 capsule by mouth once a week. 01/01/18   [provider]      Allergies    Morphine and related and Vicodin [hydrocodone-acetaminophen]    Review of Systems   Review of Systems  Unable to perform ROS: Mental status change    Physical Exam Updated Vital Signs BP (!) 114/49   Pulse 84   Temp 97.8 F (36.6 C) (Oral)   Resp 19   SpO2 99%  Physical Exam General: Older-than-stated-age appearing female, lying in bed. Room smells of urine. Patient lying in bed supine with her knees drawn up to her chest.  HEENT: PERRLA, Sclera anicteric, MMM, trachea midline. Appears to have EOMI. No nystagmus. Neck supple. NCAT.  Cardiology: RRR, no murmurs/rubs/gallops. BL radial and DP pulses equal  bilaterally.  Resp: Normal respiratory rate and effort. CTAB, no wheezes, rhonchi, crackles.  Abd: Soft, non-tender, non-distended. No rebound tenderness or guarding.  GU: Deferred. MSK: No peripheral edema or signs of trauma. Extremities without deformity or TTP. No cyanosis or clubbing. Skin: warm, dry. Multiple ecchymoses on all four extremities.  Back: No C/T/L-spine tenderness or stepoffs.  Neuro: Drowsy but arousable, oriented x0, not responding to many  questions. GCS 12-13. CNs II-XII grossly intact. MAEs. Localizes pain in all four extremities.   Psych: UTA  ED Results / Procedures / Treatments   Labs (all labs ordered are listed, but only abnormal results are displayed) Labs Reviewed  CBC - Abnormal; Notable for the following components:      Result Value   WBC 24.1 (*)    RBC 2.80 (*)    Hemoglobin 8.0 (*)    HCT 24.6 (*)    RDW 17.8 (*)    Platelets 111 (*)    nRBC 0.3 (*)    All other components within normal limits  LACTIC ACID, PLASMA - Abnormal; Notable for the following components:   Lactic Acid, Venous 2.0 (*)    All other components within normal limits  AMMONIA - Abnormal; Notable for the following components:   Ammonia 50 (*)    All other components within normal limits  COMPREHENSIVE METABOLIC PANEL - Abnormal; Notable for the following components:   Sodium 133 (*)    Potassium 5.2 (*)    CO2 20 (*)    Glucose, Bld 47 (*)    BUN 40 (*)    Creatinine, Ser 1.25 (*)    Calcium 7.7 (*)    Total Protein 5.3 (*)    Albumin 1.9 (*)    AST 106 (*)    Alkaline Phosphatase 232 (*)    Total Bilirubin 3.0 (*)    GFR, Estimated 48 (*)    All other components within normal limits  I-STAT CHEM 8, ED - Abnormal; Notable for the following components:   BUN 46 (*)    Creatinine, Ser 1.20 (*)    Glucose, Bld 102 (*)    Calcium, Ion 1.02 (*)    Hemoglobin 8.5 (*)    HCT 25.0 (*)    All other components within normal limits  TROPONIN I (HIGH SENSITIVITY) - Abnormal; Notable for the following components:   Troponin I (High Sensitivity) 46 (*)    All other components within normal limits  TROPONIN I (HIGH SENSITIVITY) - Abnormal; Notable for the following components:   Troponin I (High Sensitivity) 39 (*)    All other components within normal limits  MAGNESIUM  LACTIC ACID, PLASMA  ETHANOL  URINALYSIS, ROUTINE W REFLEX MICROSCOPIC  CBG MONITORING, ED    EKG EKG Interpretation  Date/Time:  Monday January 14 2022  18:05:43 EST Ventricular Rate:  81 PR Interval:  126 QRS Duration: 88 QT Interval:  405 QTC Calculation: 471 R Axis:   78 Text Interpretation: Sinus rhythm Confirmed by Cindee Lame [phone]) on 01/14/2022 7:21:08 PM  Radiology DG Chest Portable 1 View  Result Date: 01/14/2022 CLINICAL DATA:  Altered mental status. EXAM: PORTABLE CHEST 1 VIEW COMPARISON:  December 11, 2021 FINDINGS: The heart size and mediastinal contours are within normal limits. Both lungs are clear. A radiopaque stent is seen overlying the medial aspect of the right upper quadrant. Multiple chronic bilateral rib fractures are seen. IMPRESSION: No active cardiopulmonary disease. Electronically Signed   By: Virgina Norfolk M.D.   On: 01/14/2022 19:15  Procedures Procedures    Medications Ordered in ED Medications  lactated ringers bolus 500 mL (has no administration in time range)  lactulose (CHRONULAC) 10 GM/15ML solution 10 g (has no administration in time range)  lactated ringers bolus 1,000 mL (has no administration in time range)    ED Course/ Medical Decision Making/ A&P                          Medical Decision Making Amount and/or Complexity of Data Reviewed Labs: ordered. Decision-making details documented in ED Course. Radiology: ordered. Decision-making details documented in ED Course.  Risk Prescription drug management. Decision regarding hospitalization.    This patient presents to the ED for concern of AMS, this involves an extensive number of treatment options, and is a complaint that carries with it a high risk of complications and morbidity.  I considered the following differential and admission for this acute, potentially life threatening condition.   MDM:    Ddx of acute altered mental status or encephalopathy considered but not limited to: -Hepatic encephalopathy or uremia; given history and recent admission for hepatic encephalopathy w/ h/o medical noncompliance, this is highest on  the differential and will order EtOH and ammonia -Intracranial abnormalities such as ICH, hydrocephalus, head trauma -Infection such as UTI, PNA, or meningitis - patient is afebrile and moving neck freely, no apparent meningismus -Toxic ingestion such as opioid overdose, anticholinergic toxicity, EtOH abuse -Electrolyte abnormalities or hyper/hypoglycemia -ACS or arrhythmia   Clinical Course as of 01/14/22 2034  Mon Jan 14, 2022  1803 IV team failed x 3 at access. PA Lavone Orn will attempt with [place]. [HN]  1804 WBC(!): 24.1 [HN]  1804 Hemoglobin(!): 8.0 [HN]  1804 Creatinine(!): 1.20 [HN]  1804 Glucose(!): 102 [HN]  1804 BUN(!): 46 [HN]  1920 Potassium(!): 5.2 [HN]  1920 BUN(!): 40 [HN]  1920 Glucose(!): 47 [HN]  1920 Glucose-Capillary: 96 [HN]  1920 Glucose 47 on CMP but 96 on POC recheck [HN]  1920 DG Chest Portable 1 View FINDINGS: The heart size and mediastinal contours are within normal limits. Both lungs are clear. A radiopaque stent is seen overlying the medial aspect of the right upper quadrant. Multiple chronic bilateral rib fractures are seen.  IMPRESSION: No active cardiopulmonary disease.   [HN]  2005 Lactic Acid, Venous(!!): 2.0 [HN]  2005 Ammonia(!): 50 Will order lactulose [HN]  2005 Troponin I (High Sensitivity)(!): 39 [HN]  2009 Total Bilirubin(!): 3.0 Uptrending, most recent values 1.9 --> 2.6 now 3.0 [HN]  2009 AST(!): 106 Uptrending from 40s [HN]  2010 Creatinine(!): 1.25 BL ~1 [HN]  2010 Lactic Acid, Venous(!!): 2.0 Treating with fluid bolus [HN]  2023 Admitted to hospitalist for hepatic encephalopathy, AMS, leukocytosis, pending UA [HN]    Clinical Course User Index [HN] Audley Hose, MD    Labs: I Ordered, and personally interpreted labs.  The pertinent results include:  those listed above  Imaging Studies ordered: I ordered imaging studies including CTH I independently visualized and interpreted imaging. I agree with the radiologist  interpretation  Additional history obtained from chart review.  External records from outside source obtained and reviewed including [facility] admission recently  Cardiac Monitoring: The patient was maintained on a cardiac monitor.  I personally viewed and interpreted the cardiac monitored which showed an underlying rhythm of: NSR  Reevaluation: After the interventions noted above, I reevaluated the patient and found that they have :improved  Social Determinants of Health: Patient lives alone  Disposition:  Admit for AMS likely hepatic encephalopathy, white count elevated to 24, pending UA  Co morbidities that complicate the patient evaluation  Past Medical History:  Diagnosis Date   Arthritis    COPD (chronic obstructive pulmonary disease) ([facility])    Emphysema of lung ([facility])    Gallstones    GERD (gastroesophageal reflux disease)    Personal history of colonic polyps - adenomas 03/17/2013   03/17/2013 7 left polyps removed max 10 mm     Pneumonia      Medicines Meds ordered this encounter  Medications   lactated ringers bolus 500 mL   lactulose (CHRONULAC) 10 GM/15ML solution 10 g   lactated ringers bolus 1,000 mL    I have reviewed the patients home medicines and have made adjustments as needed  Problem List / ED Course: Problem List Items Addressed This Visit   None Visit Diagnoses     Hepatic encephalopathy ([place])    -  Primary   Transaminitis       Dehydration                       This note was created using dictation software, which may contain spelling or grammatical errors.    Audley Hose, MD 01/14/22 [phone]

## 2022-01-14 NOTE — ED Notes (Signed)
RN called lab to check on blood work, lab lost labs. RN attempted to stick pt, unable to obatain labs. IV consult put in

## 2022-01-14 NOTE — ED Notes (Signed)
Writer spoke with Marlowe Kays (family member) and gave update on patient as well as general plan of care.

## 2022-01-14 NOTE — H&P (Addendum)
History and Physical      [name] SWF:[id number] DOB: [birth year] DOA: 01/14/2022  PCP: Prince Solian, MD  Patient coming from: home   I have personally briefly reviewed patient's old medical records in [place]  Chief Complaint: Altered mental status  HPI: Elizabeth Cox is a 67 y.o. female with medical history significant for alcoholic cirrhosis status post TIPS, recurrent hepatic encephalopathy, COPD, current tobacco abuse, anemia of chronic disease, who is admitted to [facility] on 01/14/2022 with acute encephalopathy after presenting from home to [facility] ED for evaluation of altered mental status.  In the setting of the patient's altered mental status, following history is provided by the patient's daughter as well as via my discussions with the EDP as well as from chart review.  She has a documented history of alcoholic cirrhosis status post TIPS, with recurrent hepatic encephalopathy.  The patient was recently hospitalized at [facility] for acute hepatic encephalopathy, with this hospitalization.  The last from 12/26/2021 to 12/31/2021.  Her mental status reportedly improved with lactulose throughout the hospital course, and she was socially discharged home on 12/31/2021, with final ammonia level noted during that hospitalization to be 65 when checked on 12/31/2021.   Earlier today, the patient's daughter found the patient laying in bed, confused, somewhat somnolent, and overall noted to be of altered mental status relative to baseline, prompting the patient to be brought to [facility] emergency department for further evaluation management thereof.  Duration which the patient was laying in bed before being discovered by her daughter is not entirely clear, however, daughter conveys that the patient's altered mental status qualitatively is very similar to that with which she had presented at the time of recent hospitalization at [facility] for acute hepatic  encephalopathy.   Patient with a reported history of suboptimal medical compliance.  Unclear compliance with lactulose in the interval since discharge from [facility].  Additional labs from recent hospitalization at [facility] notable for the following: From 12/28/2021 to 12/31/2021, serum creatinine noted to be in the range of 1.01-1.19; hemoglobin 8.8 on 12/31/2021 white blood cell count 9500 on 12/31/2021.   Additionally, per chart review, recent liver enzymes were notable for the following: Liver enzymes on 12/31/2021 demonstrated the following: Alkaline phosphatase 113, AST 49, ALT 27, total bilirubin 2.6.  This was preceded by liver enzymes from 12/26/2021, which were notable for the following: Alkaline phosphatase 121, AST 30, ALT 22, total bilirubin 2.3.  Medical history also notable for documentation of a history of COPD in the setting of chronic tobacco abuse.  She also has a history of cholecystectomy.     ED Course:  Vital signs in the ED were notable for the following: Afebrile, with temperature max noted to be 97.8; heart rate 22-02; systolic blood pressures in the low 100s to 120s; respiratory rate 15-23, oxygen saturation 99 to 100% on room air.  Labs were notable for the following: CMP notable for the following: Sodium 133, potassium 5.2, bicarbonate 20, anion gap 12, BUN 40, creatinine 1.25, BUN/creatinine ratio 32, glucose 96.  Serum iron is low 1.9.  Calcium, adjusted for hypoalbuminemia noted to be 9.3, albumin 1.9.  Alkaline phosphatase 232, AST 106, ALT 38, total bilirubin 3.0.  Initial high-sensitivity troponin I found to be 46, with repeat value trending down to 39, without any prior high-sensitivity troponin I values available for comparison.  CBC notable for will with cell count 24,100 without associated differential, hemoglobin  8.0 associated normocytic/normochromic properties, platelet count 111.  Serum ethanol and urinalysis results currently pending.   Initial lactate 2.0, with repeat value currently pending.  Ammonia 50.  Per my interpretation, EKG in ED demonstrated the following: Sinus rhythm with heart rate 81, normal intervals, nonspecific T wave inversion in aVL, and no evidence of ST changes, including no evidence of ST elevation.  Imaging and additional notable ED work-up: Chest x-ray, 1 view, per formal radiology read shows multiple chronic bilateral rib fractures without any evidence of acute rib fracture, while demonstrating no evidence of acute cardiopulmonary process, including no evidence of infiltrate, edema, effusion, or pneumothorax.  Noncontrast CT head ordered, with results currently pending.  While in the ED, the following were administered: Lactated Ringer's x 1.5 L bolus; and EDP ordered first dose of oral lactulose.  Subsequently, the patient was admitted for further evaluation and management of presenting acute encephalopathy, with potential for underlying acute hepatic encephalopathy, with clinical evidence of intravascular depletion, with presenting labs notable for leukocytosis, acute prerenal azotemia, acute on chronic anemia.      Review of Systems: As per HPI otherwise 10 point review of systems negative.   Past Medical History:  Diagnosis Date   Alcoholic cirrhosis ([place])    s/p TIPS   Arthritis    COPD (chronic obstructive pulmonary disease) (HCC)    Emphysema of lung (HCC)    Gallstones    GERD (gastroesophageal reflux disease)    Hepatic encephalopathy ([place])    Personal history of colonic polyps - adenomas 03/17/2013   03/17/2013 7 left polyps removed max 10 mm     Pneumonia     Past Surgical History:  Procedure Laterality Date   CHOLECYSTECTOMY     COLONOSCOPY  2015   ESOPHAGOGASTRODUODENOSCOPY  2015   TONSILLECTOMY      Social History:  reports that she has been smoking cigarettes. She has a 67.50 pack-year smoking history. She has never used smokeless tobacco. She reports that she does not  drink alcohol and does not use drugs.   Allergies  Allergen Reactions   Morphine And Related Nausea And Vomiting   Vicodin [Hydrocodone-Acetaminophen] Nausea And Vomiting    Family History  Problem Relation Age of Onset   Colon cancer Mother 61   Diabetes Mother    Heart disease Mother    Emphysema Mother        smoker   Breast cancer Cousin    Cancer Father        unsure type, father's hx unknown to patient    Family history reviewed and not pertinent    Prior to Admission medications   Medication Sig Start Date End Date Taking? Authorizing Provider  CALCIUM PO Take 1 tablet by mouth daily.    [provider]  FLUoxetine (PROZAC) 20 MG capsule Take 1 capsule by mouth daily. 02/23/18   [provider]  oxyCODONE-acetaminophen (PERCOCET) 10-325 MG per tablet Take 1 tablet by mouth as needed for pain. Patient taking differently: Take 1 tablet by mouth 3 (three) times daily.  03/17/13   Gatha Mayer, MD  pantoprazole (PROTONIX) 40 MG tablet TAKE 1 TABLET BY MOUTH TWICE DAILY 08/04/20   Gatha Mayer, MD  promethazine (PHENERGAN) 25 MG tablet Take 1 tablet (25 mg total) by mouth every 6 (six) hours as needed for nausea or vomiting. 05/14/18   Gatha Mayer, MD  sucralfate (CARAFATE) 1 g tablet Take 1 tablet (1 g total) by mouth 4 (four) times  daily -  with meals and at bedtime. Patient not taking: Reported on 05/14/2018 08/28/15   Davonna Belling, MD  Vitamin D, Ergocalciferol, (DRISDOL) 1.25 MG (50000 UT) CAPS capsule Take 1 capsule by mouth once a week. 01/01/18   [provider]     Objective    Physical Exam: Vitals:   01/14/22 1830 01/14/22 1845 01/14/22 1900 01/14/22 2030  BP: (!) 101/51 (!) 107/51 (!) 114/49 (!) 117/48  Pulse: 85 88 84 85  Resp: _0 Temp:      TempSrc:      SpO2: 100% 100% 99% 100%    General: appears to be stated age; confused Skin: warm, dry, no rash Head:  AT/[place] Mouth:  Oral mucosa membranes appear  moist, normal dentition Neck: supple; trachea midline Heart:  RRR; did not appreciate any M/R/G Lungs: CTAB, did not appreciate any wheezes, rales, or rhonchi Abdomen: + BS; soft, ND, NT Vascular: 2+ pedal pulses b/l; 2+ radial pulses b/l Extremities: no peripheral edema, no muscle wasting    Labs on Admission: I have personally reviewed following labs and imaging studies  CBC: Recent Labs  Lab 01/14/22 1503 01/14/22 1510  WBC 24.1*  --   HGB 8.0* 8.5*  HCT 24.6* 25.0*  MCV 87.9  --   PLT 111*  --    Basic Metabolic Panel: Recent Labs  Lab 01/14/22 1503 01/14/22 1510 01/14/22 1513  NA  --  139 133*  K  --  3.7 5.2*  CL  --  101 101  CO2  --   --  20*  GLUCOSE  --  102* 47*  BUN  --  46* 40*  CREATININE  --  1.20* 1.25*  CALCIUM  --   --  7.7*  MG 1.9  --   --    GFR: CrCl cannot be calculated (Unknown ideal weight.). Liver Function Tests: Recent Labs  Lab 01/14/22 1513  AST 106*  ALT 38  ALKPHOS 232*  BILITOT 3.0*  PROT 5.3*  ALBUMIN 1.9*   No results for input(s): "LIPASE", "AMYLASE" in the last 168 hours. Recent Labs  Lab 01/14/22 1816  AMMONIA 50*   Coagulation Profile: No results for input(s): "INR", "PROTIME" in the last 168 hours. Cardiac Enzymes: No results for input(s): "CKTOTAL", "CKMB", "CKMBINDEX", "TROPONINI" in the last 168 hours. BNP (last 3 results) No results for input(s): "PROBNP" in the last 8760 hours. HbA1C: No results for input(s): "HGBA1C" in the last 72 hours. CBG: Recent Labs  Lab 01/14/22 1846  GLUCAP 96   Lipid Profile: No results for input(s): "CHOL", "HDL", "LDLCALC", "TRIG", "CHOLHDL", "LDLDIRECT" in the last 72 hours. Thyroid Function Tests: No results for input(s): "TSH", "T4TOTAL", "FREET4", "T3FREE", "THYROIDAB" in the last 72 hours. Anemia Panel: No results for input(s): "VITAMINB12", "FOLATE", "FERRITIN", "TIBC", "IRON", "RETICCTPCT" in the last 72 hours. Urine analysis:    Component Value Date/Time    COLORURINE YELLOW 08/28/2015 1113   APPEARANCEUR CLEAR 08/28/2015 1113   LABSPEC 1.004 (L) 08/28/2015 1113   PHURINE 7.0 08/28/2015 1113   GLUCOSEU NEGATIVE 08/28/2015 1113   HGBUR TRACE (A) 08/28/2015 1113   BILIRUBINUR NEGATIVE 08/28/2015 1113   KETONESUR NEGATIVE 08/28/2015 1113   PROTEINUR NEGATIVE 08/28/2015 1113   UROBILINOGEN 0.2 06/14/2010 1848   NITRITE POSITIVE (A) 08/28/2015 1113   LEUKOCYTESUR NEGATIVE 08/28/2015 1113    Radiological Exams on Admission: DG Chest Portable 1 View  Result Date: 01/14/2022 CLINICAL DATA:  Altered mental status. EXAM: PORTABLE CHEST 1  VIEW COMPARISON:  December 11, 2021 FINDINGS: The heart size and mediastinal contours are within normal limits. Both lungs are clear. A radiopaque stent is seen overlying the medial aspect of the right upper quadrant. Multiple chronic bilateral rib fractures are seen. IMPRESSION: No active cardiopulmonary disease. Electronically Signed   By: Virgina Norfolk M.D.   On: 01/14/2022 19:15      Assessment/Plan   Principal Problem:   Acute encephalopathy Active Problems:   Alcoholic cirrhosis (HCC)   SIRS (systemic inflammatory response syndrome) (HCC)   Hyperkalemia   Dehydration   Acute prerenal azotemia   Acute on chronic anemia   Lactic acidosis   Elevated troponin   COPD (chronic obstructive pulmonary disease) (HCC)   Tobacco abuse       #) Acute metabolic encephalopathy: Presenting altered mental status, confusion, of unclear duration, as further detailed above reportedly very similar to recent hospitalization over the course for acute hepatic encephalopathy in the setting of documented history of alcoholic cirrhosis status post TIPS procedure, along with documented history of recurrent hepatic encephalopathy.  Given this history, particularly noting that she is previously undergone TIPS, she is predisposed for recurrence of hepatic encephalopathy, particularly given documentation of history of  suboptimal compliance with outpatient medications.  Overall, differential certainly includes hepatic encephalopathy, although this does represent a diagnosis of exclusion, will pursue further evaluation for other potential contributory factors leading to her encephalopathic presentation, as further detailed above.   No overt underlying infectious process at this time, including chest x-ray which showed no evidence of acute cardiopulmonary process, will noting that urinalysis is currently pending.  She does have a leukocytosis, which appears new but no fever or evidence of meningeal signs to further increase suspicion for meningitis.  There may also be a contribution towards her leukocytosis from hemoconcentration given unclear duration of her altered mental status, during which she would experience diminished oral intake.  Overall, given no evidence of underlying infectious process in this patient with alternate clinical explanations for her leukocytosis, along with current hemodynamic stability and afebrile nature, will refrain from empiric IV antibiotics at this time, while pursuing further evaluation for any underlying infection as further detailed below.  Additionally, given unclear duration of time which the patient was laying in bed, with presenting mild elevation in lactic acid level, will also add on CPK level to evaluate for any contributory rhabdomyolysis.  Per chart review, she also has documentation history of chronic pain syndrome for which she was previously prescribed scheduled Percocet, potentially offering a toxic contribution to her presenting encephalopathy.  Will check urinary drug screen to further assess.  Also given a long smoking history with documentation of COPD, will check VBG to evaluate for any contribution from hypercapnic encephalopathy.  No overt acute focal neurologic deficits at this time to suggest underlying acute CVA, less CT head has been ordered by EDP, with result  currently pending.  No overt evidence of seizure activity at this time.  Overall, will proceed with reinitiation of lactulose, while pursuing evaluation for alternate sources of her encephalopathy, as outlined below.   Plan: fall precautions. Repeat CMP/CBC in the AM. check TSH, vbg, CPK level.  Lactulose.  Hold home scheduled Percocet.  Follow-up result urinalysis.  Check urine drug screen.  Number calcitonin level.  Follow-up for serum ethanol level.  Follow-up results of CT head.  Prn Narcan.  Delirium precautions ordered.  Check INR.  Every 6 hours CBG monitoring x 3 occurrences to evaluate for any contribution from  hypoglycemia, with corresponding order for prn amp of D50 for CBG less than 70.            #) SIRS criteria present: Presentation associated with presenting leukocytosis, mild tachycardia, mild tachypnea, in the absence of fever.  No overt source of underlying infection at this time, including chest x-ray which showed no evidence of acute cardiopulmonary process, We will urinalysis is currently pending.  Meningitis felt to be less likely, as further detailed above.  In the absence of overt underlying infectious process, criteria for sepsis not currently met, while there are alternate, noninfectious factors, potentially contributing to her leukocytosis, as further detailed above.  Overall, in the absence of overt underlying infectious process, and is hemodynamically stable patient without fever, will refrain from empiric initiation of IV attics, further evaluating for any underlying infectious process, as further outlined below.  Of note, she has a documented history of prior pneumonia, and is at increased risk for radiographic false negative on chest x-ray in the context of clinical evidence of dehydration.  Consequently, will further evaluate with procalcitonin level.  Without significant abdominal distention, and no overt tenderness on physical exam to increase index of suspicion  for SBP at this time.  Plan: Repeat CBC with diff in the AM.  Monitor strict I's and O's and daily weights.  Monitor on telemetry. Refraining from IV abx for now, as above.  Check procalcitonin level.  Follow-up result urinalysis.  Gentle IV fluids, as further detailed below.  CMP in the morning.                 #) Dehydration: Clinical suspicion for such, including the appearance of dry oral mucous membranes as well as laboratory findings notable for acute prerenal azotemia, with suspected recent decline in oral intake in the context of presenting altered mental status.   Plan: Monitor strict I's and O's.  Daily weights.  Repeat CMP in the morning.  Gentle IV fluids overnight, as above.  Follow-up resulted urinalysis with microscopy.                #) Hyperkalemia: presenting serum potassium level of 5.2, with suspected hydration serum dehydration and extracellular potassium shift in the setting of concomitant presenting lactic acidosis, as further detailed above.  Presenting EKG without evidence of associated changes.  Plan: monitor on telemetry.  Continuous IV fluids and for lactated Ringer's at 75 cc/h x 8 hours, as above.  Repeat Mg level in the morning.  Recheck CMP and serum Mg in the AM.  Further trending lactic acid level, as further to similar.  Check VBG.             #) Acute on chronic anemia : In the context of documented history of  anemia of chronic disease, a/w with baseline hgb range 9-11, with most recent prior hemoglobin noted to be 8.8 on 12/31/2021, presenting hemoglobin slightly low at 8.0, associate with normocytic/normochromic properties.  No overt evidence of active bleed at this time, but will closely monitor for ensuing evidence of such given increased risk for acute GI bleed in the setting of alcoholic cirrhosis, will also noting laboratory evidence of acute prerenal azotemia, which is more likely on the basis of dehydration, but  warrants further trending of BUN level in this context.  Additionally, patient's hemoglobin being slightly low relative to baseline with overall appearance of intravascular depletion is notable given what would otherwise been a suspected hemoconcentrated result.  Will also monitor for interval worsening of  hepatic function and potential for associated contribution to acute on chronic anemia, as further outlined below.  Patient with mild thrombocytopenia, likely as a consequence of her underlying cirrhosis.  Less likely to be on the basis of DIC, will closely monitor ensuing INR/PTT and bilirubin trend.    Plan: Every 4 hour H&H's ordered through 9 AM on 01/15/2022.  Repeat CBC in the morning.  Add on INR, PTT.  INR in AM.  Repeat CMP in the morning.  Check direct bilirubin.  Type and screen.  Add on iron studies, G18, folic acid level, reticulocyte count.  Type and screen           #) Elevated troponin: mildly elevated initial troponin of 46, with repeat value trending down to 39. No prior high sensitivity troponin I value available for point of comparison.  Suspect that this mildly elevated troponin is on the basis of supply demand mismatch in the setting of interval decline in oxygen delivery capacity in the setting of presenting acute on chronic anemia, swelling detailed above as opposed to representing a type I process due to acute plaque rupture. EKG shows no evidence of acute ischemic changes, including no evidence of STEMI, and CXR showed no acute CP process, including no evidence of pneumothorax.  However, in the setting of unclear downtime before patient was found by her daughter and limited ability to assess for any recent chest pain in the setting of the patient's current altered mental status, differential includes recent MI with ensuing downward trend troponin.  Consequently, will also evaluate with echocardiogram the morning.  Overall, ACS is felt to be less likely relative to type 2 supply  demand mismatch, as above, but will closely monitor on telemetry overnight, and pursue echo in the morning, as above.   Plan: PRN EKG for development of chest pain.  Recheck serum Mg level in the morning and repeat CMP in the morning.  Repeat CBC in the AM.  Echocardiogram in the morning.  Further evaluation management of presenting acute on chronic anemia, as above.               #) Lactic Acidosis: Initial lactate mildly elevated 2.0, likely multifactorial in nature, with suspected contributions from no known underlying alcoholic cirrhosis as well as suspected contribution from clinical evidence of intravascular depletion.  Also assessing CPK level given unclear duration of time laying in bed before found by daughter earlier today, as above.  As noted above, criteria for sepsis not currently met in the absence of any evidence of underlying infectious process.  Received 1.5 L LR bolus in the ED.  Will continue additional gentle IV fluids, and closely follow for ensuing trend and lactate level, while expanding diagnostic evaluation as outlined below.  Plan: CMP/CBC in the AM. Monitor strict I's & O's. Check salicylate level. Check INR.  Lactated Ringer's at 75 cc/h x 8 hours.  Repeat lactic acid level currently pending.  Add on CPK level.  Follow-up for urinalysis.  Add on urinary drug screen, procalcitonin level.  Follow-up result of serum ethanol level.             #) Alcoholic cirrhosis: Documented history of such, status post TIPS procedure, with documentation of recurrent hepatic encephalopathy.  Slight interval increase in transaminases, including slight interval increase in alk phos as well as total bilirubin and this patient has previously undergone cholecystectomy.  After this trend entirely clear at this time, but will add on and trend INR to assist with  further clinical correlation and monitoring for interval worsening of hepatic function.  With INR currently pending,  unable to currently calculate MELD score.  In the setting of interval worsening of transaminases, with presenting leukocytosis leukocytosis and altered mental status, differential could also include acute alcoholic hepatitis, will noting that serum ethanol level is currently pending.  Overall, unclear if the patient has continued to consume alcohol as an outpatient.    Plan: Monitor strict I's and O's and daily weights.  Repeat CMP, CBC.check and trend INR.  Follow-up results of serum ethanol level.  Check direct bilirubin, GGT.  Check urinary drug screen.  Reinitiation of lactulose, as above.            #) COPD: Documented history thereof, without clinical evidence of acute exacerbation at this time.   Outpatient respiratory regimen includes the following: None.  However, in the setting of presenting altered mental status, will check VBG to evaluate for any contribution from hypercapnic encephalopathy.  Plan:  Prn albuterol nebulizer. Check CMP and serum magnesium level in the AM.  Check VBG.              #) GERD: documented h/o such; on twice daily Protonix as outpatient.   Plan: Protonix 40 mg IV twice daily             #) Chronic tobacco abuse: Per chart review, it appears that patient has a long smoking history, and at last documentation, was continuing to smoke, with report of having smoked 1.5 packs/day for greater than 30 years.  Notable in the setting of documented history of COPD .   Plan: As mental status improves, would plan to counsel the patient on the importance of complete smoking discontinuation.  Order placed for prn nicotine patch for use during this hospitalization.     DVT prophylaxis: SCD's   Code Status: Full code, presumed in setting of patient's altered mental status Disposition Plan: Per Rounding Team Consults called: none;  Admission status: Inpatient     I SPENT GREATER THAN 75  MINUTES IN CLINICAL CARE TIME/MEDICAL  DECISION-MAKING IN COMPLETING THIS ADMISSION.      [place] DO [facility]  From [place]   01/14/2022, 9:29 PM

## 2022-01-14 NOTE — ED Notes (Signed)
Lab called RN and stated urine spilled and recollection is needed. Pt was in and out cathed the 1st time.

## 2022-01-15 ENCOUNTER — Inpatient Hospital Stay (HOSPITAL_COMMUNITY): Payer: BLUE CROSS/BLUE SHIELD

## 2022-01-15 DIAGNOSIS — I340 Nonrheumatic mitral (valve) insufficiency: Secondary | ICD-10-CM

## 2022-01-15 DIAGNOSIS — R7989 Other specified abnormal findings of blood chemistry: Secondary | ICD-10-CM | POA: Diagnosis not present

## 2022-01-15 DIAGNOSIS — G9341 Metabolic encephalopathy: Secondary | ICD-10-CM | POA: Diagnosis not present

## 2022-01-15 LAB — COMPREHENSIVE METABOLIC PANEL
ALT: 37 U/L (ref 0–44)
AST: 70 U/L — ABNORMAL HIGH (ref 15–41)
Albumin: 1.7 g/dL — ABNORMAL LOW (ref 3.5–5.0)
Alkaline Phosphatase: 135 U/L — ABNORMAL HIGH (ref 38–126)
Anion gap: 7 (ref 5–15)
BUN: 30 mg/dL — ABNORMAL HIGH (ref 8–23)
CO2: 24 mmol/L (ref 22–32)
Calcium: 7.7 mg/dL — ABNORMAL LOW (ref 8.9–10.3)
Chloride: 105 mmol/L (ref 98–111)
Creatinine, Ser: 1.04 mg/dL — ABNORMAL HIGH (ref 0.44–1.00)
GFR, Estimated: >60 mL/min (ref >60)
Glucose, Bld: 110 mg/dL — ABNORMAL HIGH (ref 70–99)
Potassium: 3.3 mmol/L — ABNORMAL LOW (ref 3.5–5.1)
Sodium: 136 mmol/L (ref 135–145)
Total Bilirubin: 4.5 mg/dL — ABNORMAL HIGH (ref 0.3–1.2)
Total Protein: 5 g/dL — ABNORMAL LOW (ref 6.5–8.1)

## 2022-01-15 LAB — ECHOCARDIOGRAM COMPLETE
Area-P 1/2: 2.92 cm2
Calc EF: 77.3 %
MV M vel: 4.32 m/s
MV Peak grad: 74.6 mmHg
Single Plane A2C EF: 81.4 %
Single Plane A4C EF: 72.7 %

## 2022-01-15 LAB — CBC WITH DIFFERENTIAL/PLATELET
Abs Immature Granulocytes: 0.17 10*3/uL — ABNORMAL HIGH (ref 0.00–0.07)
Basophils Absolute: 0 10*3/uL (ref 0.0–0.1)
Basophils Relative: 0 %
Eosinophils Absolute: 0 10*3/uL (ref 0.0–0.5)
Eosinophils Relative: 0 %
HCT: 27.2 % — ABNORMAL LOW (ref 36.0–46.0)
Hemoglobin: 8.9 g/dL — ABNORMAL LOW (ref 12.0–15.0)
Immature Granulocytes: 1 %
Lymphocytes Relative: 9 %
Lymphs Abs: 1.4 10*3/uL (ref 0.7–4.0)
MCH: 28.4 pg (ref 26.0–34.0)
MCHC: 32.7 g/dL (ref 30.0–36.0)
MCV: 86.9 fL (ref 80.0–100.0)
Monocytes Absolute: 1.7 10*3/uL — ABNORMAL HIGH (ref 0.1–1.0)
Monocytes Relative: 11 %
Neutro Abs: 11.8 10*3/uL — ABNORMAL HIGH (ref 1.7–7.7)
Neutrophils Relative %: 79 %
Platelets: 94 10*3/uL — ABNORMAL LOW (ref 150–400)
RBC: 3.13 MIL/uL — ABNORMAL LOW (ref 3.87–5.11)
RDW: 16.6 % — ABNORMAL HIGH (ref 11.5–15.5)
WBC: 15.1 10*3/uL — ABNORMAL HIGH (ref 4.0–10.5)
nRBC: 0 % (ref 0.0–0.2)

## 2022-01-15 LAB — BILIRUBIN, DIRECT: Bilirubin, Direct: 1.4 mg/dL — ABNORMAL HIGH (ref 0.0–0.2)

## 2022-01-15 LAB — PROTIME-INR
INR: 1.6 — ABNORMAL HIGH (ref 0.8–1.2)
Prothrombin Time: 19.2 seconds — ABNORMAL HIGH (ref 11.4–15.2)

## 2022-01-15 LAB — GAMMA GT: GGT: 30 U/L (ref 7–50)

## 2022-01-15 LAB — PREPARE RBC (CROSSMATCH)

## 2022-01-15 LAB — MAGNESIUM: Magnesium: 1.7 mg/dL (ref 1.7–2.4)

## 2022-01-15 LAB — CBG MONITORING, ED
Glucose-Capillary: 92 mg/dL (ref 70–99)
Glucose-Capillary: 90 mg/dL (ref 70–99)
Glucose-Capillary: 92 mg/dL (ref 70–99)

## 2022-01-15 LAB — HEMOGLOBIN AND HEMATOCRIT, BLOOD
HCT: 20.2 % — ABNORMAL LOW (ref 36.0–46.0)
Hemoglobin: 6.6 g/dL — CL (ref 12.0–15.0)

## 2022-01-15 MED ORDER — SODIUM CHLORIDE 0.9% IV SOLUTION: Freq: Once | INTRAVENOUS | Status: AC

## 2022-01-15 NOTE — ED Notes (Signed)
Pt given water to drink. Writer assisted patient in eating applesauce, patient also ate a couple bites of a [place] sandwich.

## 2022-01-15 NOTE — Progress Notes (Signed)
  Echocardiogram 2D Echocardiogram has been performed.  Eartha Inch 01/15/2022, 10:54 AM

## 2022-01-15 NOTE — ED Notes (Signed)
Writer changed patient dirty pads/brief and performed peri care on patient.

## 2022-01-15 NOTE — ED Notes (Signed)
Patient had an episode of urinary incontinence. Patient cleaned, placed in a clean brief on a clean chux.

## 2022-01-15 NOTE — Progress Notes (Signed)
PROGRESS NOTE    Elizabeth Cox  [id number]:[id number] DOB: [birth year] DOA: 01/14/2022 PCP: Prince Solian, MD   Brief Narrative:  Elizabeth Cox is a 67 y.o. female with medical history significant for alcoholic cirrhosis status post TIPS, recurrent hepatic encephalopathy, COPD, current tobacco abuse, anemia of chronic disease, who is admitted to [facility] on 01/14/2022 with acute encephalopathy after presenting from home to [facility] ED for evaluation of altered mental status.  TRH called for admission.  Assessment & Plan:   Principal Problem:   Acute metabolic encephalopathy Active Problems:   Acute encephalopathy   Alcoholic cirrhosis (HCC)   SIRS (systemic inflammatory response syndrome) (HCC)   Hyperkalemia   Dehydration   Acute prerenal azotemia   Acute on chronic anemia   Lactic acidosis   Elevated troponin   COPD (chronic obstructive pulmonary disease) (HCC)   Tobacco abuse  Acute hepatic encephalopathy Complicated by noncompliance Rule out infection Resume lactulose Mental status improving somewhat over the past 12 hours CT head without acute process Once more awake alert and oriented will continue education on need for compliance  SIRS without source for infection, likely reactive given above Lactic acidosis -Leukocytosis, tachypnea at intake without source of infection -Questionably secondary to above versus profound dehydration and poor p.o. intake -Continue supportive care -Hold off on antibiotics until appropriate source, given improvement likely reactive more than infection in etiology  AKI and volume depletion Hypovolemic hyponatremia Hyperkalemia -Likely secondary to poor p.o. intake, continue IV fluids, monitor closely given hepatic disease, avoid hypervolemia -Replete electrolytes as necessary  Acute on chronic anemia of chronic disease Partially hemodilutional -Likely chronic anemia secondary to chronic disease, sparse labs in our system, unknown  baseline -Dilutional overnight with aggressive IV fluids, continue to follow, no sources or signs of bleeding at this time. -INR 1.6 secondary to liver disease placing patient at high risk for bleeding  DVT prophylaxis: Holding given anemia as above, rule out bleeding prior to initiating Code Status: Full Family Communication: None present, patient indicates she already updated her daughter, unclear if this is accurate given her mental status  Status is: Inpatient  Dispo: The patient is from: Home              Anticipated d/c is to: To be determined              Anticipated d/c date is: 48-72 hours              Patient currently not medically stable for discharge  Consultants:  None  Procedures:  None  Antimicrobials:  None indicated  Subjective: Review of systems limited given patient's somnolence, overtly denies pain nausea vomiting headache fevers or chills  Objective: Vitals:   01/15/22 0615 01/15/22 0630 01/15/22 0645 01/15/22 0700  BP: (!) 112/48 (!) 112/43 (!) 107/44 (!) 108/43  Pulse: 81 81 82 83  Resp: (!) 22 16 (!) 23 (!) 25  Temp:      TempSrc:      SpO2: 100% 100% 100% 100%    Intake/Output Summary (Last 24 hours) at 01/15/2022 0738 Last data filed at 01/15/2022 8341 Gross per 24 hour  Intake 2302.92 ml  Output --  Net 2302.92 ml   There were no vitals filed for this visit.  Examination:  General:  Pleasantly resting in bed, No acute distress.  Alert to person only HEENT:  Normocephalic atraumatic.  Sclerae nonicteric, noninjected.  Extraocular movements intact bilaterally. Neck:  Without mass or deformity.  Trachea is midline.  Lungs:  Clear to auscultate bilaterally without rhonchi, wheeze, or rales. Heart:  Regular rate and rhythm.  Without murmurs, rubs, or gallops. Abdomen:  Soft, nontender, nondistended.  Without guarding or rebound. Extremities: Without cyanosis, clubbing, edema, or obvious deformity. Vascular:  Dorsalis pedis and posterior tibial  pulses palpable bilaterally. Skin:  Warm and dry, no erythema, no ulcerations.   Data Reviewed: I have personally reviewed following labs and imaging studies  CBC: Recent Labs  Lab 01/14/22 1503 01/14/22 1510 01/15/22 0122  WBC 24.1*  --   --   HGB 8.0* 8.5* 6.6*  HCT 24.6* 25.0* 20.2*  MCV 87.9  --   --   PLT 111*  --   --    Basic Metabolic Panel: Recent Labs  Lab 01/14/22 1503 01/14/22 1510 01/14/22 1513  NA  --  139 133*  K  --  3.7 5.2*  CL  --  101 101  CO2  --   --  20*  GLUCOSE  --  102* 47*  BUN  --  46* 40*  CREATININE  --  1.20* 1.25*  CALCIUM  --   --  7.7*  MG 1.9  --   --    GFR: CrCl cannot be calculated (Unknown ideal weight.). Liver Function Tests: Recent Labs  Lab 01/14/22 1513  AST 106*  ALT 38  ALKPHOS 232*  BILITOT 3.0*  PROT 5.3*  ALBUMIN 1.9*   No results for input(s): "LIPASE", "AMYLASE" in the last 168 hours. Recent Labs  Lab 01/14/22 1816  AMMONIA 50*   Coagulation Profile: Recent Labs  Lab 01/14/22 1816  INR 1.9*   Cardiac Enzymes: Recent Labs  Lab 01/14/22 1816  CKTOTAL 55   BNP (last 3 results) No results for input(s): "PROBNP" in the last 8760 hours. HbA1C: No results for input(s): "HGBA1C" in the last 72 hours. CBG: Recent Labs  Lab 01/14/22 1846  GLUCAP 96   Lipid Profile: No results for input(s): "CHOL", "HDL", "LDLCALC", "TRIG", "CHOLHDL", "LDLDIRECT" in the last 72 hours. Thyroid Function Tests: Recent Labs    01/14/22 1445  TSH 1.200   Anemia Panel: Recent Labs    01/14/22 1445 01/14/22 1816  VITAMINB12 743  --   FOLATE  --  19.3  FERRITIN  --  77  TIBC  --  193*  IRON  --  29  RETICCTPCT 4.2*  --    Sepsis Labs: Recent Labs  Lab 01/14/22 1816 01/14/22 2031  PROCALCITON 1.32  --   LATICACIDVEN 2.0* 1.6    No results found for this or any previous visit (from the past 240 hour(s)).       Radiology Studies: CT Head Wo Contrast  Result Date: 01/14/2022 CLINICAL DATA:   Altered mental status EXAM: CT HEAD WITHOUT CONTRAST TECHNIQUE: Contiguous axial images were obtained from the base of the skull through the vertex without intravenous contrast. RADIATION DOSE REDUCTION: This exam was performed according to the departmental dose-optimization program which includes automated exposure control, adjustment of the mA and/or kV according to patient size and/or use of iterative reconstruction technique. COMPARISON:  12/26/2021 FINDINGS: Brain: There is no mass, hemorrhage or extra-axial collection. The size and configuration of the ventricles and extra-axial CSF spaces are normal. The brain parenchyma is normal, without acute or chronic infarction. Vascular: Atherosclerotic calcification of the vertebral and internal carotid arteries at the skull base. No abnormal hyperdensity of the major intracranial arteries or dural venous sinuses. Skull: The visualized skull base, calvarium and extracranial soft  tissues are normal. Sinuses/Orbits: No fluid levels or advanced mucosal thickening of the visualized paranasal sinuses. No mastoid or middle ear effusion. The orbits are normal. IMPRESSION: 1. No acute intracranial abnormality. 2. Atherosclerotic calcification of the vertebral and internal carotid arteries at the skull base. Electronically Signed   By: Ulyses Jarred M.D.   On: 01/14/2022 23:18   DG Chest Portable 1 View  Result Date: 01/14/2022 CLINICAL DATA:  Altered mental status. EXAM: PORTABLE CHEST 1 VIEW COMPARISON:  December 11, 2021 FINDINGS: The heart size and mediastinal contours are within normal limits. Both lungs are clear. A radiopaque stent is seen overlying the medial aspect of the right upper quadrant. Multiple chronic bilateral rib fractures are seen. IMPRESSION: No active cardiopulmonary disease. Electronically Signed   By: Virgina Norfolk M.D.   On: 01/14/2022 19:15    Scheduled Meds:  lactulose  30 g Oral TID   pantoprazole (PROTONIX) IV  40 mg Intravenous Q12H    Continuous Infusions:   LOS: 1 day   Time spent: 8mn  Cesiah Westley C Destyni Hoppel, DO [facility]  If 7PM-7AM, please contact night-coverage [email or web address]  01/15/2022, 7:38 AM

## 2022-01-15 NOTE — ED Notes (Signed)
Patient incontinent of urine, patient cleaned, placed in clean brief.

## 2022-01-16 DIAGNOSIS — K7682 Hepatic encephalopathy: Secondary | ICD-10-CM | POA: Diagnosis not present

## 2022-01-16 DIAGNOSIS — G9341 Metabolic encephalopathy: Secondary | ICD-10-CM | POA: Diagnosis not present

## 2022-01-16 DIAGNOSIS — D696 Thrombocytopenia, unspecified: Secondary | ICD-10-CM

## 2022-01-16 LAB — COMPREHENSIVE METABOLIC PANEL
ALT: 40 U/L (ref 0–44)
AST: 74 U/L — ABNORMAL HIGH (ref 15–41)
Albumin: 1.6 g/dL — ABNORMAL LOW (ref 3.5–5.0)
Alkaline Phosphatase: 123 U/L (ref 38–126)
Anion gap: 10 (ref 5–15)
BUN: 23 mg/dL (ref 8–23)
CO2: 22 mmol/L (ref 22–32)
Calcium: 7.6 mg/dL — ABNORMAL LOW (ref 8.9–10.3)
Chloride: 103 mmol/L (ref 98–111)
Creatinine, Ser: 1.03 mg/dL — ABNORMAL HIGH (ref 0.44–1.00)
GFR, Estimated: >60 mL/min (ref >60)
Glucose, Bld: 98 mg/dL (ref 70–99)
Potassium: 3.7 mmol/L (ref 3.5–5.1)
Sodium: 135 mmol/L (ref 135–145)
Total Bilirubin: 3.7 mg/dL — ABNORMAL HIGH (ref 0.3–1.2)
Total Protein: 4.6 g/dL — ABNORMAL LOW (ref 6.5–8.1)

## 2022-01-16 LAB — TYPE AND SCREEN
ABO/RH(D): O NEG
Antibody Screen: NEGATIVE
Unit division: 0

## 2022-01-16 LAB — BPAM RBC
Blood Product Expiration Date: 202401062359
ISSUE DATE / TIME: 202401020235
Unit Type and Rh: 9500

## 2022-01-16 LAB — CBC
HCT: 24.8 % — ABNORMAL LOW (ref 36.0–46.0)
Hemoglobin: 8.6 g/dL — ABNORMAL LOW (ref 12.0–15.0)
MCH: 29.3 pg (ref 26.0–34.0)
MCHC: 34.7 g/dL (ref 30.0–36.0)
MCV: 84.4 fL (ref 80.0–100.0)
Platelets: 68 10*3/uL — ABNORMAL LOW (ref 150–400)
RBC: 2.94 MIL/uL — ABNORMAL LOW (ref 3.87–5.11)
RDW: 16.8 % — ABNORMAL HIGH (ref 11.5–15.5)
WBC: 18 10*3/uL — ABNORMAL HIGH (ref 4.0–10.5)
nRBC: 0 % (ref 0.0–0.2)

## 2022-01-16 MED ORDER — LORAZEPAM 1 MG PO TABS: 1.0000 mg | ORAL_TABLET | ORAL | Status: AC | PRN

## 2022-01-16 MED ORDER — THIAMINE HCL 100 MG/ML IJ SOLN
100.0000 mg | Freq: Every day | INTRAMUSCULAR | Status: DC
  Administered 2022-01-16: 100 mg via INTRAVENOUS
  Filled 2022-01-16: qty 2

## 2022-01-16 MED ORDER — OXYCODONE-ACETAMINOPHEN 5-325 MG PO TABS
1.0000 | ORAL_TABLET | Freq: Four times a day (QID) | ORAL | Status: DC | PRN
  Administered 2022-01-16 – 2022-01-18 (×5): 1 via ORAL
  Filled 2022-01-16 (×5): qty 1

## 2022-01-16 MED ORDER — RIFAXIMIN 550 MG PO TABS
550.0000 mg | ORAL_TABLET | Freq: Two times a day (BID) | ORAL | Status: DC
  Administered 2022-01-16 – 2022-01-21 (×11): 550 mg via ORAL
  Filled 2022-01-16 (×11): qty 1

## 2022-01-16 MED ORDER — DULOXETINE HCL 60 MG PO CPEP
60.0000 mg | ORAL_CAPSULE | Freq: Every day | ORAL | Status: DC
  Administered 2022-01-16 – 2022-01-21 (×6): 60 mg via ORAL
  Filled 2022-01-16 (×3): qty 1
  Filled 2022-01-16: qty 2
  Filled 2022-01-16 (×2): qty 1

## 2022-01-16 MED ORDER — THIAMINE MONONITRATE 100 MG PO TABS
100.0000 mg | ORAL_TABLET | Freq: Every day | ORAL | Status: DC
  Administered 2022-01-17 – 2022-01-21 (×5): 100 mg via ORAL
  Filled 2022-01-16 (×5): qty 1

## 2022-01-16 MED ORDER — QUETIAPINE FUMARATE 25 MG PO TABS
12.5000 mg | ORAL_TABLET | ORAL | Status: AC
  Filled 2022-01-16: qty 1

## 2022-01-16 MED ORDER — FOLIC ACID 1 MG PO TABS
1.0000 mg | ORAL_TABLET | Freq: Every day | ORAL | Status: DC
  Administered 2022-01-16 – 2022-01-21 (×6): 1 mg via ORAL
  Filled 2022-01-16 (×6): qty 1

## 2022-01-16 MED ORDER — ADULT MULTIVITAMIN W/MINERALS CH
1.0000 | ORAL_TABLET | Freq: Every day | ORAL | Status: DC
  Administered 2022-01-16 – 2022-01-21 (×6): 1 via ORAL
  Filled 2022-01-16 (×6): qty 1

## 2022-01-16 NOTE — ED Notes (Signed)
Patient attempting to get out of bed and pulling on cords stating "the doctor told me I can leave". RN attempted to reorient the patient, and set her back onto bed. Patient being cooperative stating "no". Admitting doctor notified

## 2022-01-16 NOTE — Progress Notes (Signed)
PROGRESS NOTE    Elizabeth Cox  QJJ:[id number] DOB: Jul 24, 1955 DOA: 01/14/2022 PCP: Elizabeth Solian, MD   Brief Narrative:  Elizabeth Cox is a 66 y.o. female with medical history significant for alcoholic cirrhosis status post TIPS, recurrent hepatic encephalopathy, COPD, current tobacco abuse, anemia of chronic disease, who was admitted to [facility] on 01/14/2022 with acute encephalopathy after presenting from home to [facility] ED for evaluation of altered mental status.  TRH called for admission.  Assessment & Plan:  Acute hepatic encephalopathy Complicated by noncompliance Lactulose was improved.  Mentation appears to be gradually improving.  Recheck ammonia level. CT head did not show any acute findings.  Speech therapy to see for swallow evaluation.    Patient was ordered a dose of Seroquel early this morning.  Not yet given.  SIRS without source for infection, likely reactive given above Lactic acidosis -Leukocytosis, tachypnea at intake without source of infection -Questionably secondary to above versus profound dehydration and poor p.o. intake Noted to be afebrile.  WBC is noted to be elevated which could be reactive.  Continue to hold off on antibiotics.  AKI and volume depletion Hypovolemic hyponatremia Hyperkalemia -Likely secondary to poor p.o. intake.  Patient was given IV fluids with improvement.  Acute on chronic anemia of chronic disease -Likely chronic anemia secondary to chronic disease, sparse labs in our system, unknown baseline Patient was transfused 1 unit of PRBC on 1/2. Hemoglobin has improved.  Continue to monitor.  No evidence of overt bleeding.  Thrombocytopenia Likely due to liver disease. Avoid heparin products.  Leukocytosis Noted to be afebrile.  Probably reactive.  Monitor closely.  Alcoholic liver cirrhosis INR noted to be elevated.  She is status post TIPS previously.  DVT prophylaxis: SCDs for now Code Status: Full Family  Communication: No family at bedside Disposition: To be determined    Consultants:  None  Procedures:  None  Antimicrobials:  None indicated  Subjective: Patient does not answer any questions.  She is noted to be awake.  Follows certain commands but not others.  Does not appear to be in any discomfort or distress.  Objective: Vitals:   01/16/22 0645 01/16/22 0718 01/16/22 0845 01/16/22 0915  BP:  103/79 (!) 102/54 (!) 109/58  Pulse: 81 83 83 84  Resp: (!) '26 14 18 15  '$ Temp:  98 F (36.7 C)    TempSrc:  Oral    SpO2: 97% 100% 100% 100%    Examination:  General appearance: Awake alert.  In no distress.  Distracted. Resp: Clear to auscultation bilaterally.  Normal effort Cardio: S1-S2 is normal regular.  No S3-S4.  No rubs murmurs or bruit GI: Abdomen is soft.  Nontender nondistended.  Bowel sounds are present normal.  No masses organomegaly Extremities: No edema.  Moving all 4 extremities Neurologic: No obvious focal neurological     Data Reviewed: I have personally reviewed following labs and imaging studies  CBC: Recent Labs  Lab 01/14/22 1503 01/14/22 1510 01/15/22 0122 01/15/22 1220 01/16/22 0819  WBC 24.1*  --   --  15.1* 18.0*  NEUTROABS  --   --   --  11.8*  --   HGB 8.0* 8.5* 6.6* 8.9* 8.6*  HCT 24.6* 25.0* 20.2* 27.2* 24.8*  MCV 87.9  --   --  86.9 84.4  PLT 111*  --   --  94* 68*    Basic Metabolic Panel: Recent Labs  Lab 01/14/22 1503 01/14/22 1510 01/14/22 1513 01/15/22 1220 01/16/22 4818  NA  --  139 133* 136 135  K  --  3.7 5.2* 3.3* 3.7  CL  --  101 101 105 103  CO2  --   --  20* 24 22  GLUCOSE  --  102* 47* 110* 98  BUN  --  46* 40* 30* 23  CREATININE  --  1.20* 1.25* 1.04* 1.03*  CALCIUM  --   --  7.7* 7.7* 7.6*  MG 1.9  --   --  1.7  --     GFR: CrCl cannot be calculated (Unknown ideal weight.). Liver Function Tests: Recent Labs  Lab 01/14/22 1513 01/15/22 1220 01/16/22 0819  AST 106* 70* 74*  ALT 38 37 40  ALKPHOS  232* 135* 123  BILITOT 3.0* 4.5* 3.7*  PROT 5.3* 5.0* 4.6*  ALBUMIN 1.9* 1.7* 1.6*     Recent Labs  Lab 01/14/22 1816  AMMONIA 50*    Coagulation Profile: Recent Labs  Lab 01/14/22 1816 01/15/22 1220  INR 1.9* 1.6*    Cardiac Enzymes: Recent Labs  Lab 01/14/22 1816  CKTOTAL 55     CBG: Recent Labs  Lab 01/14/22 1846 01/15/22 0807 01/15/22 1249 01/15/22 1814  GLUCAP 96 92 90 92     Thyroid Function Tests: Recent Labs    01/14/22 1445  TSH 1.200    Anemia Panel: Recent Labs    01/14/22 1445 01/14/22 1816  VITAMINB12 743  --   FOLATE  --  19.3  FERRITIN  --  77  TIBC  --  193*  IRON  --  29  RETICCTPCT 4.2*  --     Sepsis Labs: Recent Labs  Lab 01/14/22 1816 01/14/22 2031  PROCALCITON 1.32  --   LATICACIDVEN 2.0* 1.6      Radiology Studies: ECHOCARDIOGRAM COMPLETE  Result Date: 01/15/2022    ECHOCARDIOGRAM REPORT   Patient Name:   Elizabeth Cox Date of Exam: 01/15/2022 Medical Rec #:  [id number]    Height:       66.5 in Accession #:    [id number]   Weight:       135.0 lb Date of Birth:  [birth year]   BSA:          1.702 m Patient Age:    89 years     BP:           110/40 mmHg Patient Gender: F            HR:           83 bpm. Exam Location:  Inpatient Procedure: 2D Echo, Color Doppler and Cardiac Doppler Indications:    Elevated troponin  History:        Patient has prior history of Echocardiogram examinations, most                 recent 12/01/2020. COPD; Risk Factors:Current Smoker. Alcoholic                 cirrhosis s/p TIPS, anemia.  Sonographer:    Elizabeth Cox Referring Phys: [id number] Elizabeth Cox  Sonographer Comments: Technically challenging study due to limited acoustic windows. Image acquisition challenging due to patient body habitus, Image acquisition challenging due to uncooperative patient and Image acquisition challenging due to respiratory motion. IMPRESSIONS  1. Left ventricular ejection fraction, by estimation, is 60 to 65%.  The left ventricle has normal function. The left ventricle has no regional wall motion abnormalities. Left ventricular diastolic parameters were normal.  2. Right ventricular systolic  function is normal. The right ventricular size is normal. There is normal pulmonary artery systolic pressure. The estimated right ventricular systolic pressure is 67.3 mmHg.  3. The mitral valve is normal in structure. Mild mitral valve regurgitation. No evidence of mitral stenosis.  4. The aortic valve is normal in structure. Aortic valve regurgitation is not visualized. No aortic stenosis is present.  5. The inferior vena cava is normal in size with greater than 50% respiratory variability, suggesting right atrial pressure of 3 mmHg. Comparison(s): Prior images unable to be directly viewed, comparison made by report only. FINDINGS  Left Ventricle: Left ventricular ejection fraction, by estimation, is 60 to 65%. The left ventricle has normal function. The left ventricle has no regional wall motion abnormalities. The left ventricular internal cavity size was normal in size. There is  no left ventricular hypertrophy. Left ventricular diastolic parameters were normal. Right Ventricle: The right ventricular size is normal. No increase in right ventricular wall thickness. Right ventricular systolic function is normal. There is normal pulmonary artery systolic pressure. The tricuspid regurgitant velocity is 2.14 m/s, and  with an assumed right atrial pressure of 3 mmHg, the estimated right ventricular systolic pressure is 41.9 mmHg. Left Atrium: Left atrial size was normal in size. Right Atrium: Right atrial size was normal in size. Pericardium: There is no evidence of pericardial effusion. Mitral Valve: The mitral valve is normal in structure. Mild mitral valve regurgitation, with centrally-directed jet. No evidence of mitral valve stenosis. MV peak gradient, 5.9 mmHg. The mean mitral valve gradient is 3.0 mmHg. Tricuspid Valve: The  tricuspid valve is normal in structure. Tricuspid valve regurgitation is trivial. No evidence of tricuspid stenosis. Aortic Valve: The aortic valve is normal in structure. Aortic valve regurgitation is not visualized. No aortic stenosis is present. Pulmonic Valve: The pulmonic valve was normal in structure. Pulmonic valve regurgitation is not visualized. No evidence of pulmonic stenosis. Aorta: The aortic root is normal in size and structure. Venous: The inferior vena cava is normal in size with greater than 50% respiratory variability, suggesting right atrial pressure of 3 mmHg. IAS/Shunts: No atrial level shunt detected by color flow Doppler.   LV Volumes (MOD) LV vol d, MOD A2C: 74.0 ml Diastology LV vol d, MOD A4C: 86.5 ml LV e' medial:    9.95 cm/s LV vol s, MOD A2C: 13.8 ml LV E/e' medial:  9.0 LV vol s, MOD A4C: 23.6 ml LV e' lateral:   11.30 cm/s LV SV MOD A2C:     60.2 ml LV E/e' lateral: 8.0 LV SV MOD A4C:     86.5 ml LV SV MOD BP:      62.1 ml RIGHT VENTRICLE RV S prime:     14.00 cm/s TAPSE (M-mode): 1.9 cm LEFT ATRIUM             Index        RIGHT ATRIUM          Index LA Vol (A2C):   54.6 ml 32.09 ml/m  RA Area:     6.50 cm LA Vol (A4C):   39.4 ml 23.15 ml/m  RA Volume:   8.93 ml  5.25 ml/m LA Biplane Vol: 49.0 ml 28.80 ml/m  AORTIC VALVE LVOT Vmax:   134.00 cm/s LVOT Vmean:  90.200 cm/s LVOT VTI:    0.269 m MITRAL VALVE               TRICUSPID VALVE MV Area (PHT): 2.92 cm    TR Peak  grad:   18.3 mmHg MV Peak grad:  5.9 mmHg    TR Vmax:        214.00 cm/s MV Mean grad:  3.0 mmHg MV Vmax:       1.21 m/s    SHUNTS MV Vmean:      90.6 cm/s   Systemic VTI: 0.27 m MV Decel Time: 260 msec MR Peak grad: 74.6 mmHg MR Vmax:      432.00 cm/s MV E velocity: 90.00 cm/s MV A velocity: 83.20 cm/s MV E/A ratio:  1.08 Mihai Croitoru MD Electronically signed by Sanda Klein MD Signature Date/Time: 01/15/2022/11:05:44 AM    Final    CT Head Wo Contrast  Result Date: 01/14/2022 CLINICAL DATA:  Altered mental  status EXAM: CT HEAD WITHOUT CONTRAST TECHNIQUE: Contiguous axial images were obtained from the base of the skull through the vertex without intravenous contrast. RADIATION DOSE REDUCTION: This exam was performed according to the departmental dose-optimization program which includes automated exposure control, adjustment of the mA and/or kV according to patient size and/or use of iterative reconstruction technique. COMPARISON:  12/26/2021 FINDINGS: Brain: There is no mass, hemorrhage or extra-axial collection. The size and configuration of the ventricles and extra-axial CSF spaces are normal. The brain parenchyma is normal, without acute or chronic infarction. Vascular: Atherosclerotic calcification of the vertebral and internal carotid arteries at the skull base. No abnormal hyperdensity of the major intracranial arteries or dural venous sinuses. Skull: The visualized skull base, calvarium and extracranial soft tissues are normal. Sinuses/Orbits: No fluid levels or advanced mucosal thickening of the visualized paranasal sinuses. No mastoid or middle ear effusion. The orbits are normal. IMPRESSION: 1. No acute intracranial abnormality. 2. Atherosclerotic calcification of the vertebral and internal carotid arteries at the skull base. Electronically Signed   By: Ulyses Jarred M.D.   On: 01/14/2022 23:18   DG Chest Portable 1 View  Result Date: 01/14/2022 CLINICAL DATA:  Altered mental status. EXAM: PORTABLE CHEST 1 VIEW COMPARISON:  December 11, 2021 FINDINGS: The heart size and mediastinal contours are within normal limits. Both lungs are clear. A radiopaque stent is seen overlying the medial aspect of the right upper quadrant. Multiple chronic bilateral rib fractures are seen. IMPRESSION: No active cardiopulmonary disease. Electronically Signed   By: Virgina Norfolk M.D.   On: 01/14/2022 19:15    Scheduled Meds:  lactulose  30 g Oral TID   pantoprazole (PROTONIX) IV  40 mg Intravenous Q12H   QUEtiapine   12.5 mg Oral STAT   Continuous Infusions:   LOS: 2 days    Bonnielee Haff,  [facility]  If 7PM-7AM, please contact night-coverage [email or web address]  01/16/2022, 9:35 AM

## 2022-01-16 NOTE — Evaluation (Signed)
Physical Therapy Evaluation Patient Details Name: Elizabeth Cox MRN: [id number] DOB: 08-14-1955 Today's Date: 01/16/2022  History of Present Illness  Elizabeth Cox is a 67 y.o. female admitted 1/1 who is admitted to [facility] with acute encephalopathy. Chest CT no evidence of acute cardiopulmonary process, including no evidence of infiltrate, edema, effusion, or pneumothorax. OVZ:CHYIFOYDX cirrhosis status post TIPS, recurrent hepatic encephalopathy, COPD, current tobacco abuse, anemia of chronic disease  Clinical Impression  Pt admitted with above diagnosis. Pt was able to ambulate with min assist with imbalance at times noted. Pt will benefit from HHPT and 24 hour care initially and states she lived alone PTA but plans to stay with sister in law.  If she doesn't stay with family, may need SNF.  Will follow acutely.  Pt currently with functional limitations due to the deficits listed below (see PT Problem List). Pt will benefit from skilled PT to increase their independence and safety with mobility to allow discharge to the venue listed below.          Recommendations for follow up therapy are one component of a multi-disciplinary discharge planning process, led by the attending physician.  Recommendations may be updated based on patient status, additional functional criteria and insurance authorization.  Follow Up Recommendations Home health PT (24 hour care)      Assistance Recommended at Discharge Frequent or constant Supervision/Assistance  Patient can return home with the following  A little help with walking and/or transfers;A little help with bathing/dressing/bathroom;Assistance with cooking/housework;Assist for transportation;Help with stairs or ramp for entrance    Equipment Recommendations Other (comment) (TBa)  Recommendations for Other Services       Functional Status Assessment Patient has had a recent decline in their functional status and demonstrates the ability to  make significant improvements in function in a reasonable and predictable amount of time.     Precautions / Restrictions Precautions Precautions: Fall Restrictions Weight Bearing Restrictions: No      Mobility  Bed Mobility Overal bed mobility: Needs Assistance Bed Mobility: Supine to Sit     Supine to sit: Min assist          Transfers Overall transfer level: Needs assistance Equipment used: 1 person hand held assist Transfers: Sit to/from Stand Sit to Stand: Min assist           General transfer comment: Assist for balance    Ambulation/Gait Ambulation/Gait assistance: Min assist Gait Distance (Feet): 100 Feet Assistive device: 1 person hand held assist Gait Pattern/deviations: Step-through pattern, Decreased stride length, Drifts right/left   Gait velocity interpretation: <1.31 ft/sec, indicative of household ambulator   General Gait Details: Pt needed a little assist for balance with ambulation as pt was unsteady with gait.  Stairs            Wheelchair Mobility    Modified Rankin (Stroke Patients Only)       Balance                                             Pertinent Vitals/Pain Pain Assessment Pain Assessment: No/denies pain    Home Living Family/patient expects to be discharged to:: Private residence Living Arrangements: Alone Available Help at Discharge: Family (sister in law) Type of Home: House Home Access: Stairs to enter   [employer] of Steps: 2   Home Layout: One level Home Equipment:  Rollator (4 wheels);BSC/3in1;Shower seat      Prior Function Prior Level of Function : Independent/Modified Independent;Driving             Mobility Comments: states she used roolator PTA but question if pt iinfo accurate       Hand Dominance        Extremity/Trunk Assessment   Upper Extremity Assessment Upper Extremity Assessment: Defer to OT evaluation    Lower Extremity Assessment Lower  Extremity Assessment: Generalized weakness    Cervical / Trunk Assessment Cervical / Trunk Assessment: Kyphotic  Communication   Communication: No difficulties  Cognition Arousal/Alertness: Awake/alert Behavior During Therapy: Flat affect, Impulsive Overall Cognitive Status: Impaired/Different from baseline Area of Impairment: Orientation, Memory, Following commands, Safety/judgement, Awareness, Problem solving                 Orientation Level: Disoriented to, Situation, Time     Following Commands: Follows one step commands with increased time Safety/Judgement: Decreased awareness of safety, Decreased awareness of deficits   Problem Solving: Difficulty sequencing, Slow processing, Decreased initiation, Requires verbal cues General Comments: Pt reports she is starting to go through DT's        General Comments General comments (skin integrity, edema, etc.): VSS    Exercises     Assessment/Plan    PT Assessment Patient needs continued PT services  PT Problem List Decreased activity tolerance;Decreased balance;Decreased mobility;Decreased knowledge of use of DME;Decreased safety awareness;Decreased knowledge of precautions;Cardiopulmonary status limiting activity       PT Treatment Interventions DME instruction;Gait training;Functional mobility training;Therapeutic activities;Therapeutic exercise;Balance training;Patient/family education    PT Goals (Current goals can be found in the Care Plan section)  Acute Rehab PT Goals Patient Stated Goal: to go home PT Goal Formulation: With patient Time For Goal Achievement: 01/30/22 Potential to Achieve Goals: Good    Frequency Min 3X/week     Co-evaluation               AM-PAC PT "6 Clicks" Mobility  Outcome Measure Help needed turning from your back to your side while in a flat bed without using bedrails?: None Help needed moving from lying on your back to sitting on the side of a flat bed without using  bedrails?: A Little Help needed moving to and from a bed to a chair (including a wheelchair)?: A Little Help needed standing up from a chair using your arms (e.g., wheelchair or bedside chair)?: A Little Help needed to walk in hospital room?: A Little Help needed climbing 3-5 steps with a railing? : A Lot 6 Click Score: 18    End of Session Equipment Utilized During Treatment: Gait belt Activity Tolerance: Patient limited by fatigue Patient left: with call bell/phone within reach (on stretcher) Nurse Communication: Mobility status PT Visit Diagnosis: Unsteadiness on feet (R26.81);Muscle weakness (generalized) (M62.81)    Time: 2297-9892 PT Time Calculation (min) (ACUTE ONLY): 19 min   Charges:   PT Evaluation $PT Eval Moderate Complexity: 1 Mod          Kirstine Jacquin M,PT Acute Rehab Services [phone]   Alvira Philips 01/16/2022, 2:04 PM

## 2022-01-16 NOTE — ED Notes (Signed)
Patient did okay taking medication. This paramedic concerned for patients ability to feed herself and to take medication as needed. Speech consult to be placed shortly.

## 2022-01-16 NOTE — Evaluation (Signed)
Clinical/Bedside Swallow Evaluation Patient Details  Name: Elizabeth Cox MRN: [id number] Date of Birth: 1955-07-10  Today's Date: 01/16/2022 Time: SLP Start Time (ACUTE ONLY): 0907 SLP Stop Time (ACUTE ONLY): 2694 SLP Time Calculation (min) (ACUTE ONLY): 16 min  Past Medical History:  Past Medical History:  Diagnosis Date   Alcoholic cirrhosis ([place])    s/p TIPS   Arthritis    COPD (chronic obstructive pulmonary disease) (HCC)    Emphysema of lung (HCC)    Gallstones    GERD (gastroesophageal reflux disease)    Hepatic encephalopathy ([place])    Personal history of colonic polyps - adenomas 03/17/2013   03/17/2013 7 left polyps removed max 10 mm     Pneumonia    Past Surgical History:  Past Surgical History:  Procedure Laterality Date   CHOLECYSTECTOMY     COLONOSCOPY  2015   ESOPHAGOGASTRODUODENOSCOPY  2015   TONSILLECTOMY     HPI:  Elizabeth Cox is a 67 y.o. female with medical history significant for alcoholic cirrhosis status post TIPS, recurrent hepatic encephalopathy, COPD, current tobacco abuse, anemia of chronic disease, who is admitted to [facility] with acute encephalopathy. Chest CT no evidence of acute cardiopulmonary process, including no evidence of infiltrate, edema, effusion, or pneumothorax.    Assessment / Plan / Recommendation  Clinical Impression  Pt initially sleeping but roused once repositioned and given time to become more alert. She was able to follow basic commands . Pt is endentulous and stated he has dentures but did not respond to inquiry of use with po's. Despite lack of dentition she masticated, manipulated and transited regular solid trial timely and efficiently. Coordinated swallows of puree and thin were also observed without indications of aspiration. If alert and able to cognitively attend to po's, recommend she continue with regular texture, thin liquids and pills with thin. She likely has the ability to feed herself but suspect will need  assist to initiate and follow through with feeding for meals due to her encephalopathy. No further ST is required. SLP Visit Diagnosis: Dysphagia, unspecified (R13.10)    Aspiration Risk  Mild aspiration risk    Diet Recommendation Regular;Thin liquid   Liquid Administration via: Cup;Straw Medication Administration: Whole meds with liquid Postural Changes: Seated upright at 90 degrees    Other  Recommendations Oral Care Recommendations: Oral care BID    Recommendations for follow up therapy are one component of a multi-disciplinary discharge planning process, led by the attending physician.  Recommendations may be updated based on patient status, additional functional criteria and insurance authorization.  Follow up Recommendations No SLP follow up      Assistance Recommended at Discharge    Functional Status Assessment    Frequency and Duration            Prognosis        Swallow Study   General Date of Onset: 01/14/22 HPI: Elizabeth Cox is a 67 y.o. female with medical history significant for alcoholic cirrhosis status post TIPS, recurrent hepatic encephalopathy, COPD, current tobacco abuse, anemia of chronic disease, who is admitted to [facility] with acute encephalopathy. Chest CT no evidence of acute cardiopulmonary process, including no evidence of infiltrate, edema, effusion, or pneumothorax. Type of Study: Bedside Swallow Evaluation Previous Swallow Assessment:  (none) Diet Prior to this Study: Regular;Thin liquids Temperature Spikes Noted: No Respiratory Status: Room air History of Recent Intubation: No Behavior/Cognition: Alert;Cooperative;Requires cueing Oral Cavity Assessment: Dry Oral Care Completed by SLP: No Oral  Cavity - Dentition: Edentulous;Dentures, not available Vision: Functional for self-feeding Self-Feeding Abilities: Needs assist Patient Positioning: Upright in bed Baseline Vocal Quality: Normal Volitional Cough: Cognitively unable to  elicit Volitional Swallow: Unable to elicit    Oral/Motor/Sensory Function Overall Oral Motor/Sensory Function: Within functional limits   Ice Chips Ice chips: Not tested   Thin Liquid Thin Liquid: Within functional limits Presentation: Straw    Nectar Thick Nectar Thick Liquid: Not tested   Honey Thick Honey Thick Liquid: Not tested   Puree Puree: Within functional limits   Solid     Solid: Within functional limits      Suzann, Lazaro 01/16/2022,9:44 AM

## 2022-01-16 NOTE — ED Notes (Signed)
Speech at bedside

## 2022-01-16 NOTE — ED Notes (Signed)
Patient attempted to get out of bed stating "I'm getting changed to leave". Patient is pulling on her cord and crawling towards edge of bed to "leave" per pt was told "I can leave". RN reoriented patient back to situation, and onto bed.

## 2022-01-16 NOTE — Progress Notes (Signed)
Female pt came from ER per stretcher with ER nurse, confused, not in distress on room air, assisted to transfer in the bed, activities well tolerated, bath given, pure wick applied to wall low suction, advised pt can pee anytime, pt agreed, connected to tele box for continuous monitoring, skin integrity assessed and witnessed by RN Delrae Rend, virtual nurse, orient pt to room and use of call bell, advised to use call bell before going out of bed, pt agreed

## 2022-01-16 NOTE — ED Notes (Signed)
Received verbal report from [place] RN at this time

## 2022-01-16 NOTE — ED Notes (Signed)
Patient soiled her brief. Patient cleaned. Patient bed refreshed with new clean linen. Patient given tylenol. Patient resting at this time. Patient curtain remains open so staff can see patient due to being altered.

## 2022-01-17 DIAGNOSIS — K7682 Hepatic encephalopathy: Secondary | ICD-10-CM | POA: Diagnosis not present

## 2022-01-17 DIAGNOSIS — D696 Thrombocytopenia, unspecified: Secondary | ICD-10-CM | POA: Diagnosis not present

## 2022-01-17 DIAGNOSIS — N3000 Acute cystitis without hematuria: Secondary | ICD-10-CM | POA: Diagnosis not present

## 2022-01-17 DIAGNOSIS — E43 Unspecified severe protein-calorie malnutrition: Secondary | ICD-10-CM | POA: Insufficient documentation

## 2022-01-17 DIAGNOSIS — G9341 Metabolic encephalopathy: Secondary | ICD-10-CM | POA: Diagnosis not present

## 2022-01-17 LAB — CBC
HCT: 25.2 % — ABNORMAL LOW (ref 36.0–46.0)
Hemoglobin: 8.8 g/dL — ABNORMAL LOW (ref 12.0–15.0)
MCH: 29.3 pg (ref 26.0–34.0)
MCHC: 34.9 g/dL (ref 30.0–36.0)
MCV: 84 fL (ref 80.0–100.0)
Platelets: 68 10*3/uL — ABNORMAL LOW (ref 150–400)
RBC: 3 MIL/uL — ABNORMAL LOW (ref 3.87–5.11)
RDW: 17 % — ABNORMAL HIGH (ref 11.5–15.5)
WBC: 23.5 10*3/uL — ABNORMAL HIGH (ref 4.0–10.5)
nRBC: 0.1 % (ref 0.0–0.2)

## 2022-01-17 LAB — COMPREHENSIVE METABOLIC PANEL
ALT: 44 U/L (ref 0–44)
AST: 65 U/L — ABNORMAL HIGH (ref 15–41)
Albumin: 1.7 g/dL — ABNORMAL LOW (ref 3.5–5.0)
Alkaline Phosphatase: 141 U/L — ABNORMAL HIGH (ref 38–126)
Anion gap: 9 (ref 5–15)
BUN: 17 mg/dL (ref 8–23)
CO2: 22 mmol/L (ref 22–32)
Calcium: 7.7 mg/dL — ABNORMAL LOW (ref 8.9–10.3)
Chloride: 102 mmol/L (ref 98–111)
Creatinine, Ser: 1.03 mg/dL — ABNORMAL HIGH (ref 0.44–1.00)
GFR, Estimated: >60 mL/min (ref >60)
Glucose, Bld: 95 mg/dL (ref 70–99)
Potassium: 3.2 mmol/L — ABNORMAL LOW (ref 3.5–5.1)
Sodium: 133 mmol/L — ABNORMAL LOW (ref 135–145)
Total Bilirubin: 4.3 mg/dL — ABNORMAL HIGH (ref 0.3–1.2)
Total Protein: 4.9 g/dL — ABNORMAL LOW (ref 6.5–8.1)

## 2022-01-17 LAB — AMMONIA: Ammonia: 54 umol/L — ABNORMAL HIGH (ref 9–35)

## 2022-01-17 MED ORDER — PANTOPRAZOLE SODIUM 40 MG PO TBEC
40.0000 mg | DELAYED_RELEASE_TABLET | Freq: Two times a day (BID) | ORAL | Status: DC
  Administered 2022-01-17 – 2022-01-21 (×9): 40 mg via ORAL
  Filled 2022-01-17 (×9): qty 1

## 2022-01-17 MED ORDER — POTASSIUM CHLORIDE CRYS ER 20 MEQ PO TBCR
40.0000 meq | EXTENDED_RELEASE_TABLET | Freq: Once | ORAL | Status: AC
  Administered 2022-01-17: 40 meq via ORAL
  Filled 2022-01-17: qty 2

## 2022-01-17 MED ORDER — SODIUM CHLORIDE 0.9 % IV SOLN
1.0000 g | INTRAVENOUS | Status: DC
  Administered 2022-01-17 – 2022-01-20 (×4): 1 g via INTRAVENOUS
  Filled 2022-01-17 (×4): qty 10

## 2022-01-17 MED ORDER — BOOST / RESOURCE BREEZE PO LIQD CUSTOM
1.0000 | Freq: Three times a day (TID) | ORAL | Status: DC
  Administered 2022-01-17 – 2022-01-21 (×8): 1 via ORAL

## 2022-01-17 NOTE — TOC Initial Note (Signed)
Transition of Care [facility]) - Initial/Assessment Note    Patient Details  Name: Elizabeth Cox MRN: [id number] Date of Birth: [birth year]  Transition of Care [facility]) CM/SW Contact:    Pollie Friar, RN Phone Number: 01/17/2022, 3:59 PM  Clinical Narrative:                 CM met with the patient at the bedside. She lives at home alone. During conversation pt stuck on topic of going home today. CM explained reasons she can't discharge today and she would talk in circles back to same topics. She did giver permission for CM to speak to her daughter.  CM called daughter, Rosann Auerbach and she is in agreement with SNF rehab for her mother. She states she was recently in [facility] hospital and was to go to rehab and pt refused. They discharged her to daughters home. Daughter says she was exhibiting confusion and unable to care for self.  Pts home is being sold soon and she wont have a place to stay. Daughter also interested in LT care. CM has encouraged her to call [place] and see if pt qualifies as medicaid. CM also asked if [facility] could reach out to help provide her in LT placement options. She was in agreement.  Daughter asked that pt be faxed out in the [facility] area. CM will provide bed offers as available.  TOC following.  Expected Discharge Plan: Skilled Nursing Facility Barriers to Discharge: Continued Medical Work up   Patient Goals and CMS Choice   CMS Medicare.gov Compare Post Acute Care list provided to:: Patient Represenative (must comment) Choice offered to / list presented to : Adult Children      Expected Discharge Plan and Services In-house Referral: Clinical Social Work Discharge Planning Services: CM Consult Post Acute Care Choice: [place] Living arrangements for the past 2 months: Single Family Home                                      Prior Living Arrangements/Services Living arrangements for the past 2 months: Single Family  Home Lives with:: Self Patient language and need for interpreter reviewed:: Yes Do you feel safe going back to the place where you live?: Yes      Need for Family Participation in Patient Care: Yes (Comment) Care giver support system in place?: No (comment) Current home services: DME (cane/ walker/ shower seat) Criminal Activity/Legal Involvement Pertinent to Current Situation/Hospitalization: No - Comment as needed  Activities of Daily Living Home Assistive Devices/Equipment: None ADL Screening (condition at time of admission) Patient's cognitive ability adequate to safely complete daily activities?: Yes Is the patient deaf or have difficulty hearing?: No Does the patient have difficulty seeing, even when wearing glasses/contacts?: No Does the patient have difficulty concentrating, remembering, or making decisions?: Yes Patient able to express need for assistance with ADLs?: Yes Does the patient have difficulty dressing or bathing?: Yes Independently performs ADLs?: Yes (appropriate for developmental age) Does the patient have difficulty walking or climbing stairs?: No Weakness of Legs: None Weakness of Arms/Hands: None  Permission Sought/Granted                  Emotional Assessment Appearance:: Appears stated age Attitude/Demeanor/Rapport: Suspicious Affect (typically observed): Other (comment) (talks in circles) Orientation: : Oriented to Self, Oriented to  Time Alcohol / Substance Use: Alcohol Use Psych  Involvement: No (comment)  Admission diagnosis:  Hepatic encephalopathy ([place]) [K76.82] Dehydration [E86.0] Transaminitis [R74.01] Acute encephalopathy [G93.40] Patient Active Problem List   Diagnosis Date Noted   Protein-calorie malnutrition, severe 01/17/2022   SIRS (systemic inflammatory response syndrome) ([place]) 01/14/2022   Hyperkalemia 01/14/2022   Dehydration 01/14/2022   Acute prerenal azotemia 01/14/2022   Acute on chronic anemia 01/14/2022   Lactic  acidosis 01/14/2022   Elevated troponin 01/14/2022   COPD (chronic obstructive pulmonary disease) ([place]) 01/14/2022   Tobacco abuse 02/13/4386   Acute metabolic encephalopathy 87/57/9728   Mild protein-calorie malnutrition ([place]) 20/60/1561   Alcoholic cirrhosis ([place]) 53/79/4327   Acute encephalopathy 03/16/2021   Alcohol abuse 01/23/2021   Anemia of chronic disease 08/01/2020   Other cirrhosis of liver ([place]) 04/04/2020   Multiple pulmonary nodules determined by computed tomography of lung 10/03/2016   Esophageal reflux 08/31/2015   Personal history of colonic polyps - adenomas 03/17/2013   Centrilobular emphysema ([place]) 02/19/2013   S/P cholecystectomy 02/19/2013   PCP:  Prince Solian, MD Pharmacy:   [facility] [address] [place] [address] Phone: [phone] Fax: [phone]  [facility] [place], [place] [address] Phone: [phone] Fax: [phone]  [id number] [place] [place]Phone: [phone]: [phone]    Social Determinants of Health (SDOH) Social History: SDOH Screenings   Food Insecurity: No Food Insecurity (01/16/2022)  Housing: Low Risk  (01/16/2022)  Transportation Needs: No Transportation Needs (01/16/2022)  Utilities: Not At Risk (01/16/2022)  Tobacco Use: High Risk (01/14/2022)   SDOH Interventions:     Readmission Risk Interventions     No data to display

## 2022-01-17 NOTE — Evaluation (Signed)
Occupational Therapy Evaluation Patient Details Name: Elizabeth Cox MRN: [id number] DOB: 23-Aug-1955 Today's Date: 01/17/2022   History of Present Illness Elizabeth Cox is a 67 y.o. female admitted 1/1 who is admitted to [facility] with acute encephalopathy. Chest CT no evidence of acute cardiopulmonary process, including no evidence of infiltrate, edema, effusion, or pneumothorax. [id number] cirrhosis status post TIPS, recurrent hepatic encephalopathy, COPD, current tobacco abuse, anemia of chronic disease   Clinical Impression   PTA, per pt, she was independent, but pt questionable historian, as sister in law on phone during session and reporting pt recently stayed with daughter and was max unsafe to be alone. Upon eval, pt performing UB ADL with supervision and LB ADL with min guard-min A. Performing functional mobility with Min A. Pt oriented to person and time, but not situation. With max confusion, asking same questions repetitively, and with poor problem solving. Due to confusion and decreased safety during ADL and IADL, recommending SNF for continued OT services as family reports they are unable to provide 24/7 care.      Recommendations for follow up therapy are one component of a multi-disciplinary discharge planning process, led by the attending physician.  Recommendations may be updated based on patient status, additional functional criteria and insurance authorization.   Follow Up Recommendations  Skilled nursing-short term rehab (<3 hours/day)     Assistance Recommended at Discharge Frequent or constant Supervision/Assistance  Patient can return home with the following A little help with walking and/or transfers;A little help with bathing/dressing/bathroom;Assistance with cooking/housework;Direct supervision/assist for medications management;Direct supervision/assist for financial management;Assist for transportation;Help with stairs or ramp for entrance    Functional  Status Assessment  Patient has had a recent decline in their functional status and/or demonstrates limited ability to make significant improvements in function in a reasonable and predictable amount of time  Equipment Recommendations  None recommended by OT    Recommendations for Other Services       Precautions / Restrictions Precautions Precautions: Fall Restrictions Weight Bearing Restrictions: No      Mobility Bed Mobility Overal bed mobility: Needs Assistance Bed Mobility: Supine to Sit, Sit to Supine     Supine to sit: Min guard Sit to supine: Min guard   General bed mobility comments: min guard A for safety    Transfers Overall transfer level: Needs assistance Equipment used: 1 person hand held assist Transfers: Sit to/from Stand Sit to Stand: Min assist           General transfer comment: Assist for safety      Balance Overall balance assessment: Mild deficits observed, not formally tested                                         ADL either performed or assessed with clinical judgement   ADL Overall ADL's : Needs assistance/impaired Eating/Feeding: Set up;Sitting   Grooming: Wash/dry hands;Min guard;Standing   Upper Body Bathing: Set up;Sitting   Lower Body Bathing: Minimal assistance;Sit to/from stand   Upper Body Dressing : Set up;Sitting   Lower Body Dressing: Sitting/lateral leans;Set up Lower Body Dressing Details (indicate cue type and reason): donning socks sitting EOB. Cues for attention to task Toilet Transfer: Minimal assistance;Ambulation;Rolling walker (2 wheels);Cueing for safety Toilet Transfer Details (indicate cue type and reason): Pt reporting she uses walker at home, but observed to carry walker much of the way, but  furniture surfing without. Min A to manage RW and rise from toilet Toileting- Clothing Manipulation and Hygiene: Supervision/safety;Sitting/lateral lean Toileting - Clothing Manipulation Details  (indicate cue type and reason): cues for attention to taskl     Functional mobility during ADLs: Minimal assistance;Rolling walker (2 wheels)       Vision Patient Visual Report: No change from baseline Vision Assessment?: No apparent visual deficits Additional Comments: Able to read buttons on phone.     Perception     Praxis      Pertinent Vitals/Pain Pain Assessment Pain Assessment: No/denies pain Pain Intervention(s): Monitored during session     Hand Dominance     Extremity/Trunk Assessment Upper Extremity Assessment Upper Extremity Assessment: Generalized weakness   Lower Extremity Assessment Lower Extremity Assessment: Defer to PT evaluation   Cervical / Trunk Assessment Cervical / Trunk Assessment: Kyphotic   Communication Communication Communication: No difficulties   Cognition Arousal/Alertness: Awake/alert Behavior During Therapy: Agitated, Impulsive Overall Cognitive Status: Impaired/Different from baseline Area of Impairment: Orientation, Memory, Following commands, Safety/judgement, Awareness, Problem solving                 Orientation Level: Disoriented to, Situation   Memory: Decreased short-term memory Following Commands: Follows one step commands with increased time Safety/Judgement: Decreased awareness of safety, Decreased awareness of deficits Awareness: Intellectual Problem Solving: Difficulty sequencing, Slow processing, Decreased initiation, Requires verbal cues General Comments: Pt agitated on arrival on phone with family member stating "I am not staying here over the weekend". Pt asking OT to let her go home and to give her a ride to her house. Pt with delayed STM, asking frequently who OT is and if OT could get her out of here. Pt requiring max education regarding role of OT prior to participation. Following one step commands with significantly increased time and observed to be moderately distracted, requiring mod cues to stay on task  (don socks, etc). Pt with emergent awareness regarding typical needs (to use restoom), but with intellectual awareness of deficits. pt with several references to only participating with therapy at home, but after session, reporting to OT that she could continue to see therapy here too. Pt reporting numbers on phone are 13 and 12; very confused.     General Comments  VSS    Exercises     Shoulder Instructions      Home Living Family/patient expects to be discharged to:: Private residence Living Arrangements: Alone Available Help at Discharge: Family (sister in law) Type of Home: House Home Access: Stairs to enter Technical brewer of Steps: 2   Home Layout: One level     Bathroom Shower/Tub: Occupational psychologist: Standard     Home Equipment: Rollator (4 wheels);BSC/3in1;Shower seat          Prior Functioning/Environment Prior Level of Function : Independent/Modified Independent;Driving             Mobility Comments: states she used roolator PTA but question if pt iinfo accurate ADLs Comments: States she was independent        OT Problem List: Decreased strength;Decreased activity tolerance;Impaired balance (sitting and/or standing);Decreased cognition;Decreased safety awareness;Decreased knowledge of use of DME or AE      OT Treatment/Interventions: Self-care/ADL training;Therapeutic exercise;DME and/or AE instruction;Therapeutic activities;Cognitive remediation/compensation;Balance training;Patient/family education    OT Goals(Current goals can be found in the care plan section) Acute Rehab OT Goals Patient Stated Goal: go home OT Goal Formulation: With patient Time For Goal Achievement: 01/31/22 Potential to Achieve  Goals: Good  OT Frequency: Min 2X/week    Co-evaluation              AM-PAC OT "6 Clicks" Daily Activity     Outcome Measure Help from another person eating meals?: None Help from another person taking care of personal  grooming?: A Little Help from another person toileting, which includes using toliet, bedpan, or urinal?: A Little Help from another person bathing (including washing, rinsing, drying)?: A Little Help from another person to put on and taking off regular upper body clothing?: A Little Help from another person to put on and taking off regular lower body clothing?: A Little 6 Click Score: 19   End of Session Equipment Utilized During Treatment: Gait belt;Rolling walker (2 wheels) Nurse Communication: Mobility status;Other (comment) (Dark, tarry looking stool)  Activity Tolerance: Patient tolerated treatment well Patient left: in bed;with call bell/phone within reach;with bed alarm set  OT Visit Diagnosis: Unsteadiness on feet (R26.81);Muscle weakness (generalized) (M62.81);Other abnormalities of gait and mobility (R26.89);Other symptoms and signs involving cognitive function                Time: 3832-9191 OT Time Calculation (min): 28 min Charges:  OT General Charges $OT Visit: 1 Visit OT Evaluation $OT Eval Moderate Complexity: 1 Mod  Elder Cyphers, OTR/L [facility] Acute Rehabilitation Office: [phone]   Magnus Ivan 01/17/2022, 1:34 PM

## 2022-01-17 NOTE — Progress Notes (Signed)
PROGRESS NOTE    ZARIANNA DICARLO  URK:[id number] DOB: 12-11-1955 DOA: 01/14/2022 PCP: Prince Solian, MD   Brief Narrative:  Elizabeth Cox is a 67 y.o. female with medical history significant for alcoholic cirrhosis status post TIPS, recurrent hepatic encephalopathy, COPD, current tobacco abuse, anemia of chronic disease, who was admitted to [facility] on 01/14/2022 with acute encephalopathy after presenting from home to [facility] ED for evaluation of altered mental status.  TRH called for admission.  Assessment & Plan:  Acute hepatic encephalopathy Complicated by noncompliance Patient was started back on lactulose.  Mentation appears to be gradually improving though she still remains confused.  Ammonia level remains elevated. She was started back on her rifaximin as well. CT head did not show any acute findings.  Speech therapy to see for swallow evaluation.    Leukocytosis/possible UTI/Lactic acidosis Initially there was no clear reason for leukocytosis.  However UA noted to be abnormal.  Patient confused.  Unable to tell if she has dysuria.  She is mildly tender in the suprapubic area.  WBC continues to rise.  We will place her on ceftriaxone for now.  Follow urine cultures. Recheck WBC.  AKI and volume depletion Hypovolemic hyponatremia Hypokalemia -Likely secondary to poor p.o. intake.  Patient was given IV fluids with improvement. Renal function is stable.  Replace potassium. Magnesium was 1.72 days ago.  Will recheck tomorrow.  Acute on chronic anemia of chronic disease -Likely chronic anemia secondary to chronic disease, sparse labs in our system, unknown baseline Patient was transfused 1 unit of PRBC on 1/2. Hemoglobin has improved.  Continue to monitor.  No evidence of overt bleeding.  Thrombocytopenia Likely due to liver disease. Avoid heparin products.  Alcoholic liver cirrhosis INR noted to be elevated.  She is status post TIPS previously.  DVT prophylaxis: SCDs  for now Code Status: Full Family Communication: No family at bedside Disposition: To be determined    Consultants:  None  Procedures:  None  Antimicrobials:  None indicated  Subjective: Seems to be more awake alert today.  Answering few questions but definitely noted to be confused.  Disoriented.  Objective: Vitals:   01/16/22 2215 01/16/22 2253 01/17/22 0333 01/17/22 0818  BP:  101/65 (!) 110/56 (!) 112/52  Pulse:  92 92 93  Resp:   20 18  Temp:  98 F (36.7 C) 98.4 F (36.9 C) 98.4 F (36.9 C)  TempSrc:  Oral Oral Oral  SpO2:  100% 100% 100%  Weight: 47.3 kg     Height: '5\' 6"'$  (1.676 m)       Examination:  General appearance: Awake alert.  In no distress.  Confused and distracted. Resp: Clear to auscultation bilaterally.  Normal effort Cardio: S1-S2 is normal regular.  No S3-S4.  No rubs murmurs or bruit GI: Abdomen is soft.  Mildly tender in the suprapubic area without any rebound rigidity guarding.  No masses organomegaly. Extremities: No edema.  Full range of motion of lower extremities. Neurologic: Disoriented.  No obvious focal neurological deficits.  Tremulous.  Could not elicit asterixis.    Data Reviewed: I have personally reviewed following labs and imaging studies  CBC: Recent Labs  Lab 01/14/22 1503 01/14/22 1510 01/15/22 0122 01/15/22 1220 01/16/22 0819 01/17/22 0307  WBC 24.1*  --   --  15.1* 18.0* 23.5*  NEUTROABS  --   --   --  11.8*  --   --   HGB 8.0* 8.5* 6.6* 8.9* 8.6* 8.8*  HCT 24.6*  25.0* 20.2* 27.2* 24.8* 25.2*  MCV 87.9  --   --  86.9 84.4 84.0  PLT 111*  --   --  94* 68* 68*    Basic Metabolic Panel: Recent Labs  Lab 01/14/22 1503 01/14/22 1510 01/14/22 1513 01/15/22 1220 01/16/22 0819 01/17/22 0307  NA  --  139 133* 136 135 133*  K  --  3.7 5.2* 3.3* 3.7 3.2*  CL  --  101 101 105 103 102  CO2  --   --  20* '24 22 22  '$ GLUCOSE  --  102* 47* 110* 98 95  BUN  --  46* 40* 30* 23 17  CREATININE  --  1.20* 1.25* 1.04*  1.03* 1.03*  CALCIUM  --   --  7.7* 7.7* 7.6* 7.7*  MG 1.9  --   --  1.7  --   --     GFR: Estimated Creatinine Clearance: 41.2 mL/min (A) (by C-G formula based on SCr of 1.03 mg/dL (H)). Liver Function Tests: Recent Labs  Lab 01/14/22 1513 01/15/22 1220 01/16/22 0819 01/17/22 0307  AST 106* 70* 74* 65*  ALT 38 37 40 44  ALKPHOS 232* 135* 123 141*  BILITOT 3.0* 4.5* 3.7* 4.3*  PROT 5.3* 5.0* 4.6* 4.9*  ALBUMIN 1.9* 1.7* 1.6* 1.7*     Recent Labs  Lab 01/14/22 1816 01/17/22 0307  AMMONIA 50* 54*    Coagulation Profile: Recent Labs  Lab 01/14/22 1816 01/15/22 1220  INR 1.9* 1.6*    Cardiac Enzymes: Recent Labs  Lab 01/14/22 1816  CKTOTAL 55     CBG: Recent Labs  Lab 01/14/22 1846 01/15/22 0807 01/15/22 1249 01/15/22 1814  GLUCAP 96 92 90 92     Thyroid Function Tests: Recent Labs    01/14/22 1445  TSH 1.200    Anemia Panel: Recent Labs    01/14/22 1445 01/14/22 1816  VITAMINB12 743  --   FOLATE  --  19.3  FERRITIN  --  77  TIBC  --  193*  IRON  --  29  RETICCTPCT 4.2*  --     Sepsis Labs: Recent Labs  Lab 01/14/22 1816 01/14/22 2031  PROCALCITON 1.32  --   LATICACIDVEN 2.0* 1.6      Radiology Studies: No results found.  Scheduled Meds:  DULoxetine  60 mg Oral Daily   feeding supplement  1 Container Oral TID BM   folic acid  1 mg Oral Daily   lactulose  30 g Oral TID   multivitamin with minerals  1 tablet Oral Daily   pantoprazole  40 mg Oral BID   rifaximin  550 mg Oral BID   thiamine  100 mg Oral Daily   Continuous Infusions:  cefTRIAXone (ROCEPHIN)  IV 1 g (01/17/22 1010)     LOS: 3 days    Bonnielee Haff,  [facility]  If 7PM-7AM, please contact night-coverage [email or web address]  01/17/2022, 11:30 AM

## 2022-01-17 NOTE — Plan of Care (Signed)

## 2022-01-17 NOTE — Progress Notes (Signed)
Initial Nutrition Assessment  DOCUMENTATION CODES:   Severe malnutrition in context of chronic illness  INTERVENTION:   Boost Breeze po TID, each supplement provides 250 kcal and 9 grams of protein  Magic cup TID with meals, each supplement provides 290 kcal and 9 grams of protein  Encourage PO intake, assistance with meal selection. Reviewed menu options and meal ordering process.    NUTRITION DIAGNOSIS:   Severe Malnutrition related to chronic illness (alcoholic cirrhosis) as evidenced by severe fat depletion, severe muscle depletion.  GOAL:   Patient will meet greater than or equal to 90% of their needs  MONITOR:   PO intake, Supplement acceptance  REASON FOR ASSESSMENT:   Malnutrition Screening Tool    ASSESSMENT:   Pt with PMH of alcoholic cirrhosis s/p TIPS, recurrent hepatic encephalopathy, COPD, current tobacco abuse, anemia of chronic disease now admitted from home with acute encephalopathy complicated by noncompliance.   Spoke with pt who was able to answer most questions. She reports her weight 2 years ago was 150 lb but has continued to lose weight. She reports no ETOH in 2 years. She previously required a paracentesis frequently until she had the TIPS procedure and now no longer has ascites. She was living at home by herself PTA and states she was able to cook and shop for herself but was not specific. She states she is moving in with her sister after d/c this admission. She does not like ensure which she last tried when her dad was sick but is agreeable to trying boost breeze and magic cup. Reviewed menu options and ordering process. Pt very confused with numbers on her phone, assisted pt calling her sister.  Pt reports normally receiving her [facility] at [facility] and states she has never seen a RD before.   Pt reports she did not eat Breakfast this am but states she will eat her lunch which was just delivered.   Noted per care everywhere pt had folate  and Vitamin B12 deficiencies which she receives supplementation for.    Medications reviewed and include: folic acid, lactulose TID, MVI with minerals, protonix, thiamine   Labs reviewed: Na 133, K 3.2, Ammonia: 54 Vitamin B12: 743    NUTRITION - FOCUSED PHYSICAL EXAM:  Flowsheet Row Most Recent Value  Orbital Region Severe depletion  Upper Arm Region Severe depletion  Thoracic and Lumbar Region Severe depletion  Buccal Region Severe depletion  Temple Region Severe depletion  Clavicle Bone Region Moderate depletion  Clavicle and Acromion Bone Region Severe depletion  Scapular Bone Region Severe depletion  Dorsal Hand Severe depletion  Patellar Region Severe depletion  Anterior Thigh Region Severe depletion  Posterior Calf Region Severe depletion  Edema (RD Assessment) None  Hair Reviewed  Eyes Reviewed  Mouth Reviewed  Skin Reviewed  Nails Reviewed       Diet Order:   Diet Order             Diet regular Room service appropriate? Yes; Fluid consistency: Thin  Diet effective now                   EDUCATION NEEDS:   Education needs have been addressed  Skin:  Skin Assessment: Reviewed RN Assessment (ecchymosis BUE and BLE)  Last BM:  1/3 medium  Height:   Ht Readings from Last 1 Encounters:  01/16/22 '5\' 6"'$  (1.676 m)    Weight:   Wt Readings from Last 1 Encounters:  01/16/22 47.3 kg    BMI:  Body mass index is 16.83 kg/m.  Estimated Nutritional Needs:   Kcal:  1700-1900  Protein:  85-100 grams  Fluid:  >1.7 L/day  Lockie Pares., RD, LDN, CNSC See AMiON for contact information

## 2022-01-17 NOTE — NC FL2 (Addendum)
[place] LEVEL OF CARE FORM     IDENTIFICATION  Patient Name: Elizabeth Cox Birthdate: 06/19/1955 Sex: female Admission Date (Current Location): 01/14/2022  [facility] and [place] Number:  [employer] and Address:  [facility] [place] [address], [place], [place] [address]      Provider Number: [id number]  Attending Physician Name and Address:  Bonnielee Haff, MD  Relative Name and Phone Number:       Current Level of Care: Hospital Recommended Level of Care: [place] Prior Approval Number:    Date Approved/Denied:   PASRR Number:  [id number]  Discharge Plan: SNF    Current Diagnoses: Patient Active Problem List   Diagnosis Date Noted   Protein-calorie malnutrition, severe 01/17/2022   SIRS (systemic inflammatory response syndrome) ([place]) 01/14/2022   Hyperkalemia 01/14/2022   Dehydration 01/14/2022   Acute prerenal azotemia 01/14/2022   Acute on chronic anemia 01/14/2022   Lactic acidosis 01/14/2022   Elevated troponin 01/14/2022   COPD (chronic obstructive pulmonary disease) ([place]) 01/14/2022   Tobacco abuse 73/41/9379   Acute metabolic encephalopathy 02/40/9735   Mild protein-calorie malnutrition ([place]) 32/99/2426   Alcoholic cirrhosis ([place]) 83/41/9622   Acute encephalopathy 03/16/2021   Alcohol abuse 01/23/2021   Anemia of chronic disease 08/01/2020   Other cirrhosis of liver ([place]) 04/04/2020   Multiple pulmonary nodules determined by computed tomography of lung 10/03/2016   Esophageal reflux 08/31/2015   Personal history of colonic polyps - adenomas 03/17/2013   Centrilobular emphysema ([place]) 02/19/2013   S/P cholecystectomy 02/19/2013    Orientation RESPIRATION BLADDER Height & Weight     Self, Time  Normal Incontinent Weight: 47.3 kg Height:  '5\' 6"'$  (167.6 cm)  BEHAVIORAL SYMPTOMS/MOOD NEUROLOGICAL BOWEL NUTRITION STATUS      Incontinent Diet (regular diet with thin liquids)  AMBULATORY STATUS  COMMUNICATION OF NEEDS Skin   Limited Assist Verbally Bruising (bruising to bilateral arms and legs)                       Personal Care Assistance Level of Assistance  Bathing, Feeding, Dressing Bathing Assistance: Limited assistance Feeding assistance: Independent Dressing Assistance: Limited assistance     Functional Limitations Info  Sight, Hearing, Speech Sight Info: Adequate Hearing Info: Adequate Speech Info: Adequate    SPECIAL CARE FACTORS FREQUENCY  PT (By licensed PT), OT (By licensed OT)     PT Frequency: 5x/wk OT Frequency: 5x/wk            Contractures Contractures Info: Not present    Additional Factors Info  Code Status, Allergies, Psychotropic Code Status Info: Full Allergies Info: morphine and related/ vicodin Psychotropic Info: Cymbalta DR 60 mg daily         Current Medications (01/17/2022):  This is the current hospital active medication list Current Facility-Administered Medications  Medication Dose Route Frequency Provider Last Rate Last Admin   acetaminophen (TYLENOL) tablet 650 mg  650 mg Oral Q6H PRN Howerter, Justin B, DO   650 mg at 01/16/22 0544   Or   acetaminophen (TYLENOL) suppository 650 mg  650 mg Rectal Q6H PRN Howerter, Justin B, DO       albuterol (PROVENTIL) (2.5 MG/3ML) 0.083% nebulizer solution 2.5 mg  2.5 mg Nebulization Q4H PRN Howerter, Justin B, DO       cefTRIAXone (ROCEPHIN) 1 g in sodium chloride 0.9 % 100 mL IVPB  1 g Intravenous Q24H Bonnielee Haff, MD 200 mL/hr at  01/17/22 1010 1 g at 01/17/22 1010   dextrose 50 % solution 50 mL  1 ampule Intravenous Q1H PRN Howerter, Justin B, DO       DULoxetine (CYMBALTA) DR capsule 60 mg  60 mg Oral Daily Bonnielee Haff, MD   60 mg at 01/17/22 0955   feeding supplement (BOOST / RESOURCE BREEZE) liquid 1 Container  1 Container Oral TID BM Bonnielee Haff, MD   1 Container at 24/09/73 5329   folic acid (FOLVITE) tablet 1 mg  1 mg Oral Daily Bonnielee Haff, MD   1 mg at  01/17/22 0955   lactulose (CHRONULAC) 10 GM/15ML solution 30 g  30 g Oral TID Howerter, Justin B, DO   30 g at 01/17/22 1530   LORazepam (ATIVAN) tablet 1-4 mg  1-4 mg Oral Q1H PRN Bonnielee Haff, MD       multivitamin with minerals tablet 1 tablet  1 tablet Oral Daily Bonnielee Haff, MD   1 tablet at 01/17/22 0955   naloxone [facility]) injection 0.4 mg  0.4 mg Intravenous PRN Howerter, Justin B, DO       nicotine (NICODERM CQ - dosed in mg/24 hours) patch 21 mg  21 mg Transdermal Daily PRN Howerter, Justin B, DO       ondansetron (ZOFRAN) injection 4 mg  4 mg Intravenous Q6H PRN Howerter, Justin B, DO       oxyCODONE-acetaminophen (PERCOCET/ROXICET) 5-325 MG per tablet 1 tablet  1 tablet Oral Q6H PRN Bonnielee Haff, MD   1 tablet at 01/17/22 1530   pantoprazole (PROTONIX) EC tablet 40 mg  40 mg Oral BID Donnamae Jude, RPH   40 mg at 01/17/22 0955   rifaximin (XIFAXAN) tablet 550 mg  550 mg Oral BID Bonnielee Haff, MD   550 mg at 01/17/22 9242   thiamine (VITAMIN B1) tablet 100 mg  100 mg Oral Daily Bonnielee Haff, MD   100 mg at 01/17/22 6834     Discharge Medications: Please see discharge summary for a list of discharge medications.  Relevant Imaging Results:  Relevant Lab Results:   Additional Information SS#: [id number]  Pollie Friar, RN

## 2022-01-17 NOTE — Progress Notes (Signed)
Mobility Specialist Progress Note   01/17/22 0900  Mobility  Activity Stood at bedside;Dangled on edge of bed (Refused amb/exercise)  Level of Assistance Minimal assist, patient does 75% or more  Assistive Device Front wheel walker  Range of Motion/Exercises Active;All extremities  Activity Response Tolerated well   Patient received in supine, A&Ox4 presenting with some confusion and forgetfulness. Very reluctant to get OOB and needed max encouragement for participation. Requires min A + extra time for bed mobility. Completed x1 STS with min A for stability + cues for hand placement. Upon standing, patient deferred further mobility and quickly returned to supine without assistance for unspecified reasons. Declined dizziness/lightheadedness and tolerated without complaint or incident. Was left in bed with all needs met, call bell in reach.   [place] Talbert Trembath, BS EXP Mobility Specialist Please contact via SecureChat or Rehab office at [phone]

## 2022-01-18 DIAGNOSIS — K7682 Hepatic encephalopathy: Secondary | ICD-10-CM | POA: Diagnosis not present

## 2022-01-18 DIAGNOSIS — G9341 Metabolic encephalopathy: Secondary | ICD-10-CM | POA: Diagnosis not present

## 2022-01-18 DIAGNOSIS — D696 Thrombocytopenia, unspecified: Secondary | ICD-10-CM | POA: Diagnosis not present

## 2022-01-18 DIAGNOSIS — N3000 Acute cystitis without hematuria: Secondary | ICD-10-CM | POA: Diagnosis not present

## 2022-01-18 LAB — COMPREHENSIVE METABOLIC PANEL
ALT: 40 U/L (ref 0–44)
AST: 53 U/L — ABNORMAL HIGH (ref 15–41)
Albumin: 1.5 g/dL — ABNORMAL LOW (ref 3.5–5.0)
Alkaline Phosphatase: 136 U/L — ABNORMAL HIGH (ref 38–126)
Anion gap: 9 (ref 5–15)
BUN: 16 mg/dL (ref 8–23)
CO2: 21 mmol/L — ABNORMAL LOW (ref 22–32)
Calcium: 7.6 mg/dL — ABNORMAL LOW (ref 8.9–10.3)
Chloride: 101 mmol/L (ref 98–111)
Creatinine, Ser: 1.09 mg/dL — ABNORMAL HIGH (ref 0.44–1.00)
GFR, Estimated: 57 mL/min — ABNORMAL LOW (ref >60)
Glucose, Bld: 99 mg/dL (ref 70–99)
Potassium: 3.1 mmol/L — ABNORMAL LOW (ref 3.5–5.1)
Sodium: 131 mmol/L — ABNORMAL LOW (ref 135–145)
Total Bilirubin: 3.4 mg/dL — ABNORMAL HIGH (ref 0.3–1.2)
Total Protein: 4.6 g/dL — ABNORMAL LOW (ref 6.5–8.1)

## 2022-01-18 LAB — CBC
HCT: 22.6 % — ABNORMAL LOW (ref 36.0–46.0)
Hemoglobin: 8 g/dL — ABNORMAL LOW (ref 12.0–15.0)
MCH: 29.2 pg (ref 26.0–34.0)
MCHC: 35.4 g/dL (ref 30.0–36.0)
MCV: 82.5 fL (ref 80.0–100.0)
Platelets: 72 10*3/uL — ABNORMAL LOW (ref 150–400)
RBC: 2.74 MIL/uL — ABNORMAL LOW (ref 3.87–5.11)
RDW: 16.9 % — ABNORMAL HIGH (ref 11.5–15.5)
WBC: 19.6 10*3/uL — ABNORMAL HIGH (ref 4.0–10.5)
nRBC: 0 % (ref 0.0–0.2)

## 2022-01-18 LAB — MAGNESIUM: Magnesium: 1.6 mg/dL — ABNORMAL LOW (ref 1.7–2.4)

## 2022-01-18 MED ORDER — MAGNESIUM SULFATE 4 GM/100ML IV SOLN
4.0000 g | Freq: Once | INTRAVENOUS | Status: AC
  Administered 2022-01-18: 4 g via INTRAVENOUS
  Filled 2022-01-18: qty 100

## 2022-01-18 MED ORDER — OXYCODONE-ACETAMINOPHEN 5-325 MG PO TABS
1.0000 | ORAL_TABLET | Freq: Four times a day (QID) | ORAL | Status: DC | PRN
  Administered 2022-01-18 – 2022-01-20 (×7): 2 via ORAL
  Filled 2022-01-18 (×7): qty 2

## 2022-01-18 MED ORDER — POTASSIUM CHLORIDE 10 MEQ/100ML IV SOLN
10.0000 meq | INTRAVENOUS | Status: AC
  Administered 2022-01-18 (×4): 10 meq via INTRAVENOUS
  Filled 2022-01-18 (×4): qty 100

## 2022-01-18 MED ORDER — POTASSIUM CHLORIDE CRYS ER 20 MEQ PO TBCR
40.0000 meq | EXTENDED_RELEASE_TABLET | Freq: Once | ORAL | Status: AC
  Administered 2022-01-18: 40 meq via ORAL
  Filled 2022-01-18: qty 2

## 2022-01-18 NOTE — TOC Progression Note (Signed)
Transition of Care [facility]) - Progression Note    Patient Details  Name: Elizabeth Cox MRN: [id number] Date of Birth: [birth year]  Transition of Care [facility]) CM/SW Contact  Pollie Friar, RN Phone Number: 01/18/2022, 11:15 AM  Clinical Narrative:    CM provided patient's daughter, Rosann Auerbach with bed offers. She selected [facility]. CM has messaged Whitney with [facility] that they accepted bed and to start insurance since its a commercial policy.  TOC following.   Expected Discharge Plan: [place] Barriers to Discharge: Continued Medical Work up  Expected Discharge Plan and Services In-house Referral: Clinical Social Work Discharge Planning Services: CM Consult Post Acute Care Choice: [place] Living arrangements for the past 2 months: Single Family Home                                       Social Determinants of Health (SDOH) Interventions SDOH Screenings   Food Insecurity: No Food Insecurity (01/16/2022)  Housing: Low Risk  (01/16/2022)  Transportation Needs: No Transportation Needs (01/16/2022)  Utilities: Not At Risk (01/16/2022)  Tobacco Use: High Risk (01/14/2022)    Readmission Risk Interventions     No data to display

## 2022-01-18 NOTE — Plan of Care (Signed)

## 2022-01-18 NOTE — Progress Notes (Signed)
Physical Therapy Treatment Patient Details Name: Elizabeth Cox MRN: [id number] DOB: 04-19-1955 Today's Date: 01/18/2022   History of Present Illness Elizabeth Cox is a 67 y.o. female admitted 1/1 who is admitted to [facility] with acute encephalopathy. Chest CT no evidence of acute cardiopulmonary process, including no evidence of infiltrate, edema, effusion, or pneumothorax. [id number] cirrhosis status post TIPS, recurrent hepatic encephalopathy, COPD, current tobacco abuse, anemia of chronic disease    PT Comments    Pt greeted supine in bed and agreeable to session with encouragement with session focused on hallway navigation and safety awareness. Pt requiring grossly min assist for all OOB mobility, requiring steadying assist during gait, however pt needing MAX directional cues, with poor safety awareness and poor problem solving throughout with pt unable to navigate back to room with no turns. Pt crawling hands first into bed at end of session, when cued to sit first before lying down (as to not get tangled in IV), pt asking "how do I do that". Discussed with supervising PT and due to decreased safety awareness pt being at increased risk for falls and requiring 24/7 supervision, and pt without 24/7 home assist, updating d/c recommendation to SNF for continued PT services. Will continue to follow acutely.    Recommendations for follow up therapy are one component of a multi-disciplinary discharge planning process, led by the attending physician.  Recommendations may be updated based on patient status, additional functional criteria and insurance authorization.  Follow Up Recommendations  Skilled nursing-short term rehab (<3 hours/day) Can patient physically be transported by private vehicle: No   Assistance Recommended at Discharge Frequent or constant Supervision/Assistance  Patient can return home with the following A little help with walking and/or transfers;A little help with  bathing/dressing/bathroom;Assistance with cooking/housework;Assist for transportation;Help with stairs or ramp for entrance   Equipment Recommendations  Other (comment) (TBD)    Recommendations for Other Services       Precautions / Restrictions Precautions Precautions: Fall Restrictions Weight Bearing Restrictions: No     Mobility  Bed Mobility Overal bed mobility: Needs Assistance Bed Mobility: Supine to Sit, Sit to Supine     Supine to sit: Min guard Sit to supine: Min guard   General bed mobility comments: min guard A for safety    Transfers Overall transfer level: Needs assistance Equipment used: 1 person hand held assist Transfers: Sit to/from Stand Sit to Stand: Min assist           General transfer comment: Assist for safety    Ambulation/Gait Ambulation/Gait assistance: Min assist Gait Distance (Feet): 125 Feet Assistive device: 1 person hand held assist Gait Pattern/deviations: Step-through pattern, Decreased stride length, Drifts right/left Gait velocity: decr     General Gait Details: Pt needing assist for balance with ambulation as pt was unsteady with gait.   Stairs             Wheelchair Mobility    Modified Rankin (Stroke Patients Only)       Balance Overall balance assessment: Mild deficits observed, not formally tested                                          Cognition Arousal/Alertness: Awake/alert Behavior During Therapy: Agitated, Impulsive Overall Cognitive Status: Impaired/Different from baseline Area of Impairment: Orientation, Memory, Following commands, Safety/judgement, Awareness, Problem solving  Orientation Level: Disoriented to, Situation, Time (time of day, orienteted to month and year)   Memory: Decreased short-term memory Following Commands: Follows one step commands with increased time Safety/Judgement: Decreased awareness of safety, Decreased awareness of  deficits Awareness: Intellectual Problem Solving: Difficulty sequencing, Slow processing, Decreased initiation, Requires verbal cues General Comments: pt awake and alert during session, persverating on kittens on floor, pt insistent on asking front desk about them, pt reporting vivid dreams, pt needing encouragement for participation        Exercises      General Comments General comments (skin integrity, edema, etc.): VSS on RA, pt needing max directional cues, ambulating in straight path to front desk, asked pt to find room and pt contineus to walk forward, needing cues to turn around to get back to room, pt unable to find room needing it pointed out when arriving      Pertinent Vitals/Pain Pain Assessment Pain Assessment: Faces Faces Pain Scale: Hurts a little bit Pain Location: flank, back Pain Descriptors / Indicators: Sore Pain Intervention(s): Monitored during session, Limited activity within patient's tolerance, Repositioned    Home Living                          Prior Function            PT Goals (current goals can now be found in the care plan section) Acute Rehab PT Goals PT Goal Formulation: With patient Time For Goal Achievement: 01/30/22    Frequency    Min 3X/week      PT Plan Discharge plan needs to be updated    Co-evaluation              AM-PAC PT "6 Clicks" Mobility   Outcome Measure  Help needed turning from your back to your side while in a flat bed without using bedrails?: None Help needed moving from lying on your back to sitting on the side of a flat bed without using bedrails?: A Little Help needed moving to and from a bed to a chair (including a wheelchair)?: A Little Help needed standing up from a chair using your arms (e.g., wheelchair or bedside chair)?: A Little Help needed to walk in hospital room?: A Lot Help needed climbing 3-5 steps with a railing? : A Lot 6 Click Score: 17    End of Session Equipment  Utilized During Treatment: Gait belt Activity Tolerance: Patient tolerated treatment well Patient left: in bed;with call bell/phone within reach;with bed alarm set Nurse Communication: Mobility status PT Visit Diagnosis: Unsteadiness on feet (R26.81);Muscle weakness (generalized) (M62.81)     Time: 6440-3474 PT Time Calculation (min) (ACUTE ONLY): 18 min  Charges:  $Gait Training: 8-22 mins                     Arsen Mangione R. PTA Acute Rehabilitation Services Office: [place] 01/18/2022, 1:11 PM

## 2022-01-18 NOTE — Progress Notes (Signed)
PROGRESS NOTE    Elizabeth Cox  ZOX:[id number] DOB: [birth year] DOA: 01/14/2022 PCP: Prince Solian, MD   Brief Narrative:  Elizabeth Cox is a 67 y.o. female with medical history significant for alcoholic cirrhosis status post TIPS, recurrent hepatic encephalopathy, COPD, current tobacco abuse, anemia of chronic disease, who was admitted to [facility] on 01/14/2022 with acute encephalopathy after presenting from home to [facility] ED for evaluation of altered mental status.  TRH called for admission.  Assessment & Plan:  Acute hepatic encephalopathy Complicated by noncompliance Patient was started back on lactulose.  Mentation seems to be gradually improving.  Continue with lactulose and rifaximin.   UTI may have also contributed and she was started on antibiotics for the same yesterday.   CT head did not show any acute findings.  Speech therapy to see for swallow evaluation.    Leukocytosis/possible UTI/Lactic acidosis Initially there was no clear reason for leukocytosis.  However UA noted to be abnormal.  Patient confused.  Unable to tell if she has dysuria.  She was mildly tender in the suprapubic area.  WBC continued to rise.  She was started on ceftriaxone.  Follow-up urine cultures. WBC slightly better today.  Recheck labs tomorrow.  AKI and volume depletion/hyponatremia/Hypokalemia/hypomagnesemia Renal function stable.  Monitor urine output.  Avoid nephrotoxic agents. Replace potassium and magnesium.  Sodium level stable most likely due to her history of liver cirrhosis.  Acute on chronic anemia of chronic disease Likely chronic anemia secondary to chronic disease, sparse labs in our system, unknown baseline Patient was transfused 1 unit of PRBC on 1/2. Hemoglobin stable for the most part.  No evidence for overt bleeding. According to care everywhere hemoglobin between 7 and 8 back in December.  Thrombocytopenia Likely due to liver disease. Avoid heparin products. Counts are  stable.  Alcoholic liver cirrhosis INR noted to be elevated.  She is status post TIPS previously.  DVT prophylaxis: SCDs for now Code Status: Full Family Communication: No family at bedside Disposition: SNF for short-term rehab    Consultants:  None  Procedures:  None  Antimicrobials:  None indicated  Subjective: More awake and alert today.  Answering questions more appropriately.  Denies any chest pain shortness of breath.  She has chronic abdominal and back pain which is stable.   Objective: Vitals:   01/18/22 0036 01/18/22 0330 01/18/22 0500 01/18/22 0728  BP: (!) 118/53 107/64  (!) 116/58  Pulse: 88 88  87  Resp:  18  18  Temp: 98.2 F (36.8 C) 98.7 F (37.1 C)  98.3 F (36.8 C)  TempSrc: Oral Oral  Oral  SpO2: 100% 100%  100%  Weight:   51.7 kg   Height:        Examination:  General appearance: Awake alert.  In no distress.  Less distracted compared to yesterday. Resp: Clear to auscultation bilaterally.  Normal effort Cardio: S1-S2 is normal regular.  No S3-S4.  No rubs murmurs or bruit GI: Abdomen is soft.  Nontender nondistended.  Bowel sounds are present normal.  No masses organomegaly Extremities: No edema.   No obvious focal neurological deficits.    Data Reviewed: I have personally reviewed following labs and imaging studies  CBC: Recent Labs  Lab 01/14/22 1503 01/14/22 1510 01/15/22 0122 01/15/22 1220 01/16/22 0819 01/17/22 0307 01/18/22 0536  WBC 24.1*  --   --  15.1* 18.0* 23.5* 19.6*  NEUTROABS  --   --   --  11.8*  --   --   --  HGB 8.0*   < > 6.6* 8.9* 8.6* 8.8* 8.0*  HCT 24.6*   < > 20.2* 27.2* 24.8* 25.2* 22.6*  MCV 87.9  --   --  86.9 84.4 84.0 82.5  PLT 111*  --   --  94* 68* 68* 72*   < > = values in this interval not displayed.    Basic Metabolic Panel: Recent Labs  Lab 01/14/22 1503 01/14/22 1510 01/14/22 1513 01/15/22 1220 01/16/22 0819 01/17/22 0307 01/18/22 0536  NA  --    < > 133* 136 135 133* 131*  K  --     < > 5.2* 3.3* 3.7 3.2* 3.1*  CL  --    < > 101 105 103 102 101  CO2  --   --  20* '24 22 22 '$ 21*  GLUCOSE  --    < > 47* 110* 98 95 99  BUN  --    < > 40* 30* '23 17 16  '$ CREATININE  --    < > 1.25* 1.04* 1.03* 1.03* 1.09*  CALCIUM  --   --  7.7* 7.7* 7.6* 7.7* 7.6*  MG 1.9  --   --  1.7  --   --  1.6*   < > = values in this interval not displayed.    GFR: Estimated Creatinine Clearance: 42.6 mL/min (A) (by C-G formula based on SCr of 1.09 mg/dL (H)). Liver Function Tests: Recent Labs  Lab 01/14/22 1513 01/15/22 1220 01/16/22 0819 01/17/22 0307 01/18/22 0536  AST 106* 70* 74* 65* 53*  ALT 38 37 40 44 40  ALKPHOS 232* 135* 123 141* 136*  BILITOT 3.0* 4.5* 3.7* 4.3* 3.4*  PROT 5.3* 5.0* 4.6* 4.9* 4.6*  ALBUMIN 1.9* 1.7* 1.6* 1.7* 1.5*     Recent Labs  Lab 01/14/22 1816 01/17/22 0307  AMMONIA 50* 54*    Coagulation Profile: Recent Labs  Lab 01/14/22 1816 01/15/22 1220  INR 1.9* 1.6*    Cardiac Enzymes: Recent Labs  Lab 01/14/22 1816  CKTOTAL 55     CBG: Recent Labs  Lab 01/14/22 1846 01/15/22 0807 01/15/22 1249 01/15/22 1814  GLUCAP 96 92 90 92      Sepsis Labs: Recent Labs  Lab 01/14/22 1816 01/14/22 2031  PROCALCITON 1.32  --   LATICACIDVEN 2.0* 1.6      Radiology Studies: No results found.  Scheduled Meds:  DULoxetine  60 mg Oral Daily   feeding supplement  1 Container Oral TID BM   folic acid  1 mg Oral Daily   lactulose  30 g Oral TID   multivitamin with minerals  1 tablet Oral Daily   pantoprazole  40 mg Oral BID   rifaximin  550 mg Oral BID   thiamine  100 mg Oral Daily   Continuous Infusions:  cefTRIAXone (ROCEPHIN)  IV 1 g (01/18/22 0956)   magnesium sulfate bolus IVPB     potassium chloride       LOS: 4 days    Bonnielee Haff,  [facility]  If 7PM-7AM, please contact night-coverage [email or web address]  01/18/2022, 10:59 AM

## 2022-01-19 DIAGNOSIS — N3000 Acute cystitis without hematuria: Secondary | ICD-10-CM | POA: Diagnosis not present

## 2022-01-19 DIAGNOSIS — D696 Thrombocytopenia, unspecified: Secondary | ICD-10-CM | POA: Diagnosis not present

## 2022-01-19 DIAGNOSIS — K7682 Hepatic encephalopathy: Secondary | ICD-10-CM | POA: Diagnosis not present

## 2022-01-19 DIAGNOSIS — G9341 Metabolic encephalopathy: Secondary | ICD-10-CM | POA: Diagnosis not present

## 2022-01-19 LAB — CBC
HCT: 25.3 % — ABNORMAL LOW (ref 36.0–46.0)
Hemoglobin: 8.6 g/dL — ABNORMAL LOW (ref 12.0–15.0)
MCH: 28.6 pg (ref 26.0–34.0)
MCHC: 34 g/dL (ref 30.0–36.0)
MCV: 84.1 fL (ref 80.0–100.0)
Platelets: 93 10*3/uL — ABNORMAL LOW (ref 150–400)
RBC: 3.01 MIL/uL — ABNORMAL LOW (ref 3.87–5.11)
RDW: 17.4 % — ABNORMAL HIGH (ref 11.5–15.5)
WBC: 24.2 10*3/uL — ABNORMAL HIGH (ref 4.0–10.5)
nRBC: 0 % (ref 0.0–0.2)

## 2022-01-19 LAB — COMPREHENSIVE METABOLIC PANEL
ALT: 54 U/L — ABNORMAL HIGH (ref 0–44)
AST: 63 U/L — ABNORMAL HIGH (ref 15–41)
Albumin: 1.6 g/dL — ABNORMAL LOW (ref 3.5–5.0)
Alkaline Phosphatase: 161 U/L — ABNORMAL HIGH (ref 38–126)
Anion gap: 8 (ref 5–15)
BUN: 11 mg/dL (ref 8–23)
CO2: 20 mmol/L — ABNORMAL LOW (ref 22–32)
Calcium: 8.2 mg/dL — ABNORMAL LOW (ref 8.9–10.3)
Chloride: 102 mmol/L (ref 98–111)
Creatinine, Ser: 1.05 mg/dL — ABNORMAL HIGH (ref 0.44–1.00)
GFR, Estimated: 59 mL/min — ABNORMAL LOW (ref >60)
Glucose, Bld: 105 mg/dL — ABNORMAL HIGH (ref 70–99)
Potassium: 4.1 mmol/L (ref 3.5–5.1)
Sodium: 130 mmol/L — ABNORMAL LOW (ref 135–145)
Total Bilirubin: 3.3 mg/dL — ABNORMAL HIGH (ref 0.3–1.2)
Total Protein: 5.3 g/dL — ABNORMAL LOW (ref 6.5–8.1)

## 2022-01-19 NOTE — Progress Notes (Signed)
PROGRESS NOTE    Elizabeth Cox  RXV:[id number] DOB: Mar 16, 1955 DOA: 01/14/2022 PCP: Prince Solian, MD   Brief Narrative:  Elizabeth Cox is a 67 y.o. female with medical history significant for alcoholic cirrhosis status post TIPS, recurrent hepatic encephalopathy, COPD, current tobacco abuse, anemia of chronic disease, who was admitted to [facility] on 01/14/2022 with acute encephalopathy after presenting from home to [facility] ED for evaluation of altered mental status.  TRH called for admission.  Assessment & Plan:  Acute hepatic encephalopathy Complicated by noncompliance Mentation is gradually improving.  Continue with lactulose and rifaximin.   UTI may have also contributed and she was started on antibiotics for the same yesterday.   CT head did not show any acute findings.  Speech therapy to see for swallow evaluation.    Leukocytosis/possible UTI/Lactic acidosis Initially there was no clear reason for leukocytosis.  However UA noted to be abnormal.  Patient confused.  Unable to tell if she has dysuria.  She was mildly tender in the suprapubic area.  WBC continued to rise.   Patient was started on ceftriaxone.  It does not look like a urine culture has been sent.  WBC remains persistently elevated though she is afebrile.  The reason for this is not entirely clear.  Could be reactive to some extent.   Will send off blood cultures.  Repeat UA.  Recheck labs tomorrow.  Will check differential as well.  AKI and volume depletion/hyponatremia/Hypokalemia/hypomagnesemia Renal function stable.  Monitor urine output.  Avoid nephrotoxic agents. Electrolytes corrected.  Continue to monitor periodically.  Acute on chronic anemia of chronic disease Likely chronic anemia secondary to chronic disease, sparse labs in our system, unknown baseline Patient was transfused 1 unit of PRBC on 1/2. Hemoglobin stable for the most part.  No evidence for overt bleeding. According to care everywhere  hemoglobin between 7 and 8 back in December.  Thrombocytopenia Likely due to liver disease. Avoid heparin products. Counts are stable.  Alcoholic liver cirrhosis INR noted to be elevated.  She is status post TIPS previously.  DVT prophylaxis: SCDs for now Code Status: Full Family Communication: No family at bedside Disposition: SNF for short-term rehab    Consultants:  None  Procedures:  None  Antimicrobials:  None indicated  Subjective: Patient denies any new complaints.  Has chronic abdominal and back pain which is stable.  Has been tolerating her diet.   Objective: Vitals:   01/18/22 1958 01/19/22 0352 01/19/22 0500 01/19/22 0729  BP: (!) 119/53 (!) 97/42  (!) 127/56  Pulse: 89 88  86  Resp: '16 16  15  '$ Temp: 98.8 F (37.1 C) 98.5 F (36.9 C)  97.9 F (36.6 C)  TempSrc: Oral Oral  Oral  SpO2: 100% 100%  98%  Weight:   51.2 kg   Height:        Examination:  General appearance: Awake alert.  In no distress.  Remains distracted but less so compared to before. Resp: Clear to auscultation bilaterally.  Normal effort Cardio: S1-S2 is normal regular.  No S3-S4.  No rubs murmurs or bruit GI: Abdomen is soft.  Mildly to diffusely without any rebound rigidity or guarding.  No masses organomegaly. Extremities: No edema.      Data Reviewed: I have personally reviewed following labs and imaging studies  CBC: Recent Labs  Lab 01/15/22 1220 01/16/22 0819 01/17/22 0307 01/18/22 0536 01/19/22 0631  WBC 15.1* 18.0* 23.5* 19.6* 24.2*  NEUTROABS 11.8*  --   --   --   --  HGB 8.9* 8.6* 8.8* 8.0* 8.6*  HCT 27.2* 24.8* 25.2* 22.6* 25.3*  MCV 86.9 84.4 84.0 82.5 84.1  PLT 94* 68* 68* 72* 93*    Basic Metabolic Panel: Recent Labs  Lab 01/14/22 1503 01/14/22 1510 01/15/22 1220 01/16/22 0819 01/17/22 0307 01/18/22 0536 01/19/22 0631  NA  --    < > 136 135 133* 131* 130*  K  --    < > 3.3* 3.7 3.2* 3.1* 4.1  CL  --    < > 105 103 102 101 102  CO2  --    <  > '24 22 22 '$ 21* 20*  GLUCOSE  --    < > 110* 98 95 99 105*  BUN  --    < > 30* '23 17 16 11  '$ CREATININE  --    < > 1.04* 1.03* 1.03* 1.09* 1.05*  CALCIUM  --    < > 7.7* 7.6* 7.7* 7.6* 8.2*  MG 1.9  --  1.7  --   --  1.6*  --    < > = values in this interval not displayed.    GFR: Estimated Creatinine Clearance: 43.8 mL/min (A) (by C-G formula based on SCr of 1.05 mg/dL (H)). Liver Function Tests: Recent Labs  Lab 01/15/22 1220 01/16/22 0819 01/17/22 0307 01/18/22 0536 01/19/22 0631  AST 70* 74* 65* 53* 63*  ALT 37 40 44 40 54*  ALKPHOS 135* 123 141* 136* 161*  BILITOT 4.5* 3.7* 4.3* 3.4* 3.3*  PROT 5.0* 4.6* 4.9* 4.6* 5.3*  ALBUMIN 1.7* 1.6* 1.7* 1.5* 1.6*     Recent Labs  Lab 01/14/22 1816 01/17/22 0307  AMMONIA 50* 54*    Coagulation Profile: Recent Labs  Lab 01/14/22 1816 01/15/22 1220  INR 1.9* 1.6*    Cardiac Enzymes: Recent Labs  Lab 01/14/22 1816  CKTOTAL 55     CBG: Recent Labs  Lab 01/14/22 1846 01/15/22 0807 01/15/22 1249 01/15/22 1814  GLUCAP 96 92 90 92     Sepsis Labs: Recent Labs  Lab 01/14/22 1816 01/14/22 2031  PROCALCITON 1.32  --   LATICACIDVEN 2.0* 1.6      Radiology Studies: No results found.  Scheduled Meds:  DULoxetine  60 mg Oral Daily   feeding supplement  1 Container Oral TID BM   folic acid  1 mg Oral Daily   lactulose  30 g Oral TID   multivitamin with minerals  1 tablet Oral Daily   pantoprazole  40 mg Oral BID   rifaximin  550 mg Oral BID   thiamine  100 mg Oral Daily   Continuous Infusions:  cefTRIAXone (ROCEPHIN)  IV 1 g (01/18/22 0956)     LOS: 5 days    Bonnielee Haff,  [facility]  If 7PM-7AM, please contact night-coverage [email or web address]  01/19/2022, 10:13 AM

## 2022-01-20 DIAGNOSIS — K7682 Hepatic encephalopathy: Secondary | ICD-10-CM | POA: Diagnosis not present

## 2022-01-20 DIAGNOSIS — N3000 Acute cystitis without hematuria: Secondary | ICD-10-CM | POA: Diagnosis not present

## 2022-01-20 DIAGNOSIS — G9341 Metabolic encephalopathy: Secondary | ICD-10-CM | POA: Diagnosis not present

## 2022-01-20 DIAGNOSIS — D696 Thrombocytopenia, unspecified: Secondary | ICD-10-CM | POA: Diagnosis not present

## 2022-01-20 DIAGNOSIS — D72829 Elevated white blood cell count, unspecified: Secondary | ICD-10-CM

## 2022-01-20 LAB — CBC WITH DIFFERENTIAL/PLATELET
Abs Immature Granulocytes: 0.32 10*3/uL — ABNORMAL HIGH (ref 0.00–0.07)
Basophils Absolute: 0 10*3/uL (ref 0.0–0.1)
Basophils Relative: 0 %
Eosinophils Absolute: 0.2 10*3/uL (ref 0.0–0.5)
Eosinophils Relative: 1 %
HCT: 22.9 % — ABNORMAL LOW (ref 36.0–46.0)
Hemoglobin: 7.7 g/dL — ABNORMAL LOW (ref 12.0–15.0)
Immature Granulocytes: 2 %
Lymphocytes Relative: 17 %
Lymphs Abs: 3.6 10*3/uL (ref 0.7–4.0)
MCH: 28.5 pg (ref 26.0–34.0)
MCHC: 33.6 g/dL (ref 30.0–36.0)
MCV: 84.8 fL (ref 80.0–100.0)
Monocytes Absolute: 2.7 10*3/uL — ABNORMAL HIGH (ref 0.1–1.0)
Monocytes Relative: 13 %
Neutro Abs: 13.9 10*3/uL — ABNORMAL HIGH (ref 1.7–7.7)
Neutrophils Relative %: 67 %
Platelets: 103 10*3/uL — ABNORMAL LOW (ref 150–400)
RBC: 2.7 MIL/uL — ABNORMAL LOW (ref 3.87–5.11)
RDW: 17.7 % — ABNORMAL HIGH (ref 11.5–15.5)
WBC: 20.7 10*3/uL — ABNORMAL HIGH (ref 4.0–10.5)
nRBC: 0 % (ref 0.0–0.2)

## 2022-01-20 LAB — COMPREHENSIVE METABOLIC PANEL
ALT: 41 U/L (ref 0–44)
AST: 40 U/L (ref 15–41)
Albumin: <1.5 g/dL — ABNORMAL LOW (ref 3.5–5.0)
Alkaline Phosphatase: 149 U/L — ABNORMAL HIGH (ref 38–126)
Anion gap: 7 (ref 5–15)
BUN: 11 mg/dL (ref 8–23)
CO2: 21 mmol/L — ABNORMAL LOW (ref 22–32)
Calcium: 8.4 mg/dL — ABNORMAL LOW (ref 8.9–10.3)
Chloride: 102 mmol/L (ref 98–111)
Creatinine, Ser: 0.99 mg/dL (ref 0.44–1.00)
GFR, Estimated: >60 mL/min (ref >60)
Glucose, Bld: 97 mg/dL (ref 70–99)
Potassium: 4 mmol/L (ref 3.5–5.1)
Sodium: 130 mmol/L — ABNORMAL LOW (ref 135–145)
Total Bilirubin: 2.7 mg/dL — ABNORMAL HIGH (ref 0.3–1.2)
Total Protein: 4.8 g/dL — ABNORMAL LOW (ref 6.5–8.1)

## 2022-01-20 LAB — MAGNESIUM: Magnesium: 2.4 mg/dL (ref 1.7–2.4)

## 2022-01-20 NOTE — Plan of Care (Signed)

## 2022-01-20 NOTE — Progress Notes (Signed)
PROGRESS NOTE    HERMIONE HAVLICEK  VHQ:[id number] DOB: 02-28-1955 DOA: 01/14/2022 PCP: Prince Solian, MD   Brief Narrative:  GENESSIS FLANARY is a 67 y.o. female with medical history significant for alcoholic cirrhosis status post TIPS, recurrent hepatic encephalopathy, COPD, current tobacco abuse, anemia of chronic disease, who was admitted to [facility] on 01/14/2022 with acute encephalopathy after presenting from home to [facility] ED for evaluation of altered mental status.    Assessment & Plan:  Acute hepatic encephalopathy Complicated by noncompliance Mentation seems to be close to baseline now.  Continue with lactulose and rifaximin.   UTI may have also contributed and she was started on antibiotics for the same.   CT head did not show any acute findings.  Speech therapy to see for swallow evaluation.    Leukocytosis/possible UTI/Lactic acidosis Initially there was no clear reason for leukocytosis.  However UA noted to be abnormal.  Patient confused.  Unable to tell if she has dysuria.  She was mildly tender in the suprapubic area.  WBC continued to rise.   Patient was started on ceftriaxone.  It does not look like a urine culture has been sent.  WBC remains persistently elevated though she is afebrile.  The reason for this is not entirely clear.  Could be reactive to some extent.  No such leukocytosis noted on previous labs.  Care everywhere also reviewed. Blood cultures without any growth so far.  WBC noted to be 20.7 today.  Will recheck labs tomorrow.  Repeat UA is pending.  AKI and volume depletion/hyponatremia/Hypokalemia/hypomagnesemia Renal function stable.  Monitor urine output.  Avoid nephrotoxic agents. Stable.  Continue to monitor periodically.  Acute on chronic anemia of chronic disease Based on care everywhere her baseline hemoglobin is between 7 and 8. Patient was transfused 1 unit of PRBC on 1/2. Hemoglobin stable for the most part.  No evidence for overt  bleeding.  Thrombocytopenia Likely due to liver disease. Avoid heparin products. Counts are stable.  Alcoholic liver cirrhosis INR noted to be elevated.  She is status post TIPS previously.  DVT prophylaxis: SCDs for now Code Status: Full Family Communication: No family at bedside Disposition: SNF for short-term rehab    Consultants:  None  Procedures:  None  Antimicrobials:  None indicated  Subjective: Complains of her usual chronic abdominal and back pain.  No new complaints.   Objective: Vitals:   01/19/22 2341 01/20/22 0329 01/20/22 0500 01/20/22 0734  BP: (!) 123/53 (!) 118/53  (!) 123/56  Pulse: 87 87  85  Resp: '17 16  16  '$ Temp: 98 F (36.7 C) 98.2 F (36.8 C)  97.9 F (36.6 C)  TempSrc: Oral Oral  Oral  SpO2: 100% 99%  100%  Weight:   51.6 kg   Height:        Examination:  General appearance: Awake alert.  In no distress.  Mildly distracted. Resp: Clear to auscultation bilaterally.  Normal effort Cardio: S1-S2 is normal regular.  No S3-S4.  No rubs murmurs or bruit GI: Abdomen is soft.  Mildly tender diffusely without any rebound rigidity or guarding.  No masses organomegaly. Extremities: No edema.     Data Reviewed: I have personally reviewed following labs and imaging studies  CBC: Recent Labs  Lab 01/15/22 1220 01/16/22 0819 01/17/22 0307 01/18/22 0536 01/19/22 0631 01/20/22 0247  WBC 15.1* 18.0* 23.5* 19.6* 24.2* 20.7*  NEUTROABS 11.8*  --   --   --   --  13.9*  HGB 8.9* 8.6* 8.8* 8.0* 8.6* 7.7*  HCT 27.2* 24.8* 25.2* 22.6* 25.3* 22.9*  MCV 86.9 84.4 84.0 82.5 84.1 84.8  PLT 94* 68* 68* 72* 93* 103*    Basic Metabolic Panel: Recent Labs  Lab 01/14/22 1503 01/14/22 1510 01/15/22 1220 01/16/22 0819 01/17/22 0307 01/18/22 0536 01/19/22 0631 01/20/22 0247  NA  --    < > 136 135 133* 131* 130* 130*  K  --    < > 3.3* 3.7 3.2* 3.1* 4.1 4.0  CL  --    < > 105 103 102 101 102 102  CO2  --    < > '24 22 22 '$ 21* 20* 21*  GLUCOSE   --    < > 110* 98 95 99 105* 97  BUN  --    < > 30* '23 17 16 11 11  '$ CREATININE  --    < > 1.04* 1.03* 1.03* 1.09* 1.05* 0.99  CALCIUM  --    < > 7.7* 7.6* 7.7* 7.6* 8.2* 8.4*  MG 1.9  --  1.7  --   --  1.6*  --  2.4   < > = values in this interval not displayed.    GFR: Estimated Creatinine Clearance: 46.8 mL/min (by C-G formula based on SCr of 0.99 mg/dL). Liver Function Tests: Recent Labs  Lab 01/16/22 0819 01/17/22 0307 01/18/22 0536 01/19/22 0631 01/20/22 0247  AST 74* 65* 53* 63* 40  ALT 40 44 40 54* 41  ALKPHOS 123 141* 136* 161* 149*  BILITOT 3.7* 4.3* 3.4* 3.3* 2.7*  PROT 4.6* 4.9* 4.6* 5.3* 4.8*  ALBUMIN 1.6* 1.7* 1.5* 1.6* <1.5*     Recent Labs  Lab 01/14/22 1816 01/17/22 0307  AMMONIA 50* 54*    Coagulation Profile: Recent Labs  Lab 01/14/22 1816 01/15/22 1220  INR 1.9* 1.6*    Cardiac Enzymes: Recent Labs  Lab 01/14/22 1816  CKTOTAL 55     CBG: Recent Labs  Lab 01/14/22 1846 01/15/22 0807 01/15/22 1249 01/15/22 1814  GLUCAP 96 92 90 92     Sepsis Labs: Recent Labs  Lab 01/14/22 1816 01/14/22 2031  PROCALCITON 1.32  --   LATICACIDVEN 2.0* 1.6      Radiology Studies: No results found.  Scheduled Meds:  DULoxetine  60 mg Oral Daily   feeding supplement  1 Container Oral TID BM   folic acid  1 mg Oral Daily   lactulose  30 g Oral TID   multivitamin with minerals  1 tablet Oral Daily   pantoprazole  40 mg Oral BID   rifaximin  550 mg Oral BID   thiamine  100 mg Oral Daily   Continuous Infusions:  cefTRIAXone (ROCEPHIN)  IV 1 g (01/19/22 1023)     LOS: 6 days    Bonnielee Haff,  [facility]  If 7PM-7AM, please contact night-coverage [email or web address]  01/20/2022, 9:11 AM

## 2022-01-21 DIAGNOSIS — G9341 Metabolic encephalopathy: Secondary | ICD-10-CM | POA: Diagnosis not present

## 2022-01-21 LAB — CBC WITH DIFFERENTIAL/PLATELET
Abs Immature Granulocytes: 0.26 10*3/uL — ABNORMAL HIGH (ref 0.00–0.07)
Basophils Absolute: 0.1 10*3/uL (ref 0.0–0.1)
Basophils Relative: 0 %
Eosinophils Absolute: 0.2 10*3/uL (ref 0.0–0.5)
Eosinophils Relative: 1 %
HCT: 24.3 % — ABNORMAL LOW (ref 36.0–46.0)
Hemoglobin: 7.9 g/dL — ABNORMAL LOW (ref 12.0–15.0)
Immature Granulocytes: 1 %
Lymphocytes Relative: 17 %
Lymphs Abs: 3.1 10*3/uL (ref 0.7–4.0)
MCH: 28.6 pg (ref 26.0–34.0)
MCHC: 32.5 g/dL (ref 30.0–36.0)
MCV: 88 fL (ref 80.0–100.0)
Monocytes Absolute: 1.7 10*3/uL — ABNORMAL HIGH (ref 0.1–1.0)
Monocytes Relative: 9 %
Neutro Abs: 12.9 10*3/uL — ABNORMAL HIGH (ref 1.7–7.7)
Neutrophils Relative %: 72 %
Platelets: 137 10*3/uL — ABNORMAL LOW (ref 150–400)
RBC: 2.76 MIL/uL — ABNORMAL LOW (ref 3.87–5.11)
RDW: 18.1 % — ABNORMAL HIGH (ref 11.5–15.5)
WBC: 18.2 10*3/uL — ABNORMAL HIGH (ref 4.0–10.5)
nRBC: 0 % (ref 0.0–0.2)

## 2022-01-21 LAB — BASIC METABOLIC PANEL
Anion gap: 7 (ref 5–15)
BUN: 14 mg/dL (ref 8–23)
CO2: 19 mmol/L — ABNORMAL LOW (ref 22–32)
Calcium: 8.4 mg/dL — ABNORMAL LOW (ref 8.9–10.3)
Chloride: 104 mmol/L (ref 98–111)
Creatinine, Ser: 1 mg/dL (ref 0.44–1.00)
GFR, Estimated: >60 mL/min (ref >60)
Glucose, Bld: 79 mg/dL (ref 70–99)
Potassium: 4.4 mmol/L (ref 3.5–5.1)
Sodium: 130 mmol/L — ABNORMAL LOW (ref 135–145)

## 2022-01-21 MED ORDER — CEPHALEXIN 500 MG PO CAPS
500.0000 mg | ORAL_CAPSULE | Freq: Three times a day (TID) | ORAL | Status: DC
  Administered 2022-01-21: 500 mg via ORAL
  Filled 2022-01-21: qty 1

## 2022-01-21 MED ORDER — ADULT MULTIVITAMIN W/MINERALS CH: 1.0000 | ORAL_TABLET | Freq: Every day | ORAL | Status: AC

## 2022-01-21 MED ORDER — VITAMIN B-1 100 MG PO TABS: 100.0000 mg | ORAL_TABLET | Freq: Every day | ORAL | Status: AC

## 2022-01-21 MED ORDER — OXYCODONE-ACETAMINOPHEN 10-325 MG PO TABS: 1.0000 | ORAL_TABLET | Freq: Four times a day (QID) | ORAL | 0 refills | Status: AC | PRN

## 2022-01-21 MED ORDER — LACTULOSE 10 GM/15ML PO SOLN: 30.0000 g | Freq: Three times a day (TID) | ORAL | 0 refills | Status: AC

## 2022-01-21 MED ORDER — FOLIC ACID 1 MG PO TABS: 1.0000 mg | ORAL_TABLET | Freq: Every day | ORAL | Status: AC

## 2022-01-21 MED ORDER — CEPHALEXIN 500 MG PO CAPS: 500.0000 mg | ORAL_CAPSULE | Freq: Three times a day (TID) | ORAL | Status: AC

## 2022-01-21 MED ORDER — ONDANSETRON HCL 4 MG PO TABS: 4.0000 mg | ORAL_TABLET | Freq: Every day | ORAL | 1 refills | Status: AC | PRN

## 2022-01-21 NOTE — Discharge Summary (Signed)
[facility]  Physician Discharge Summary   Patient ID: Elizabeth Cox MRN: [id number] DOB/AGE: Jul 09, 1955 67 y.o.  Admit date: 01/14/2022 Discharge date:   01/21/2022   PCP: Prince Solian, MD  DISCHARGE DIAGNOSES:  Acute hepatic encephalopathy   Alcoholic cirrhosis (HCC)   Hyperkalemia   Acute prerenal azotemia   Acute on chronic anemia   Elevated troponin   COPD (chronic obstructive pulmonary disease) (HCC)   Tobacco abuse   Protein-calorie malnutrition, severe   RECOMMENDATIONS FOR OUTPATIENT FOLLOW UP: Please check CBC and basic metabolic panel in 4 to 5 days   Home Health: Going to SNF Equipment/Devices: None  CODE STATUS: Full code  DISCHARGE CONDITION: fair  Diet recommendation: Low-sodium  INITIAL HISTORY: Elizabeth Cox is a 67 y.o. female with medical history significant for alcoholic cirrhosis status post TIPS, recurrent hepatic encephalopathy, COPD, current tobacco abuse, anemia of chronic disease, who was admitted to [facility] on 01/14/2022 with acute encephalopathy after presenting from home to [facility] ED for evaluation of altered mental status.       HOSPITAL COURSE:   Acute hepatic encephalopathy Complicated by noncompliance Mentation seems to be close to baseline now.  Continue with lactulose and rifaximin.   UTI may have also contributed and she was started on antibiotics for the same.   CT head did not show any acute findings.   Patient was seen by speech therapy.     Leukocytosis/possible UTI/Lactic acidosis Initially there was no clear reason for leukocytosis.  However UA noted to be abnormal.  Patient confused.  Unable to tell if she has dysuria.  She was mildly tender in the suprapubic area.  WBC continued to rise.   Patient was started on ceftriaxone.  It does not look like a urine culture has been sent.  WBC remains persistently elevated though she is afebrile.  The reason for this is not entirely clear. Could be reactive to some  extent.  No such leukocytosis noted on previous labs.  Care everywhere also reviewed. WBC noted to be better today at 18.2.  She remains afebrile.  Blood cultures without any growth. Changed over to oral antibiotics.  Recommend checking CBC in 4 to 5 days.   AKI and volume depletion/hyponatremia/Hypokalemia/hypomagnesemia Renal function stable.   Electrolytes are stable.  Acute on chronic anemia of chronic disease Based on care everywhere her baseline hemoglobin is between 7 and 8. Patient was transfused 1 unit of PRBC on 1/2. Hemoglobin stable for the most part.  No evidence for overt bleeding.   Thrombocytopenia Likely due to liver disease. Avoid heparin products. Counts are stable.   Alcoholic liver cirrhosis INR noted to be elevated.  She is status post TIPS previously.   Severe protein calorie malnutrition Nutrition Problem: Severe Malnutrition Etiology: chronic illness (alcoholic cirrhosis)  Patient is stable.  Okay for discharge to SNF today.   PERTINENT LABS:  The results of significant diagnostics from this hospitalization (including imaging, microbiology, ancillary and laboratory) are listed below for reference.    Microbiology: Recent Results (from the past 240 hour(s))  Culture, blood (Routine X 2) w Reflex to ID Panel     Status: None (Preliminary result)   Collection Time: 01/19/22 11:31 AM   Specimen: BLOOD  Result Value Ref Range Status   Specimen Description BLOOD RIGHT ANTECUBITAL  Final   Special Requests   Final    BOTTLES DRAWN AEROBIC ONLY Blood Culture adequate volume   Culture   Final    NO GROWTH  2 DAYS Performed at [facility], [place] [address] [place], [place] [address]    Report Status PENDING  Incomplete  Culture, blood (Routine X 2) w Reflex to ID Panel     Status: None (Preliminary result)   Collection Time: 01/19/22 11:33 AM   Specimen: BLOOD LEFT ARM  Result Value Ref Range Status   Specimen Description BLOOD LEFT ARM  Final    Special Requests   Final    BOTTLES DRAWN AEROBIC ONLY Blood Culture adequate volume   Culture   Final    NO GROWTH 2 DAYS Performed at [facility], [place] [address] [place], [place] [address]    Report Status PENDING  Incomplete     Labs:   Basic Metabolic Panel: Recent Labs  Lab 01/14/22 1503 01/14/22 1510 01/15/22 1220 01/16/22 0819 01/17/22 0307 01/18/22 0536 01/19/22 0631 01/20/22 0247 01/21/22 0425  NA  --    < > 136   < > 133* 131* 130* 130* 130*  K  --    < > 3.3*   < > 3.2* 3.1* 4.1 4.0 4.4  CL  --    < > 105   < > 102 101 102 102 104  CO2  --    < > 24   < > 22 21* 20* 21* 19*  GLUCOSE  --    < > 110*   < > 95 99 105* 97 79  BUN  --    < > 30*   < > '17 16 11 11 14  '$ CREATININE  --    < > 1.04*   < > 1.03* 1.09* 1.05* 0.99 1.00  CALCIUM  --    < > 7.7*   < > 7.7* 7.6* 8.2* 8.4* 8.4*  MG 1.9  --  1.7  --   --  1.6*  --  2.4  --    < > = values in this interval not displayed.   Liver Function Tests: Recent Labs  Lab 01/16/22 0819 01/17/22 0307 01/18/22 0536 01/19/22 0631 01/20/22 0247  AST 74* 65* 53* 63* 40  ALT 40 44 40 54* 41  ALKPHOS 123 141* 136* 161* 149*  BILITOT 3.7* 4.3* 3.4* 3.3* 2.7*  PROT 4.6* 4.9* 4.6* 5.3* 4.8*  ALBUMIN 1.6* 1.7* 1.5* 1.6* <1.5*    Recent Labs  Lab 01/14/22 1816 01/17/22 0307  AMMONIA 50* 54*   CBC: Recent Labs  Lab 01/15/22 1220 01/16/22 0819 01/17/22 0307 01/18/22 0536 01/19/22 0631 01/20/22 0247 01/21/22 0425 01/21/22 0808  WBC 15.1*   < > 23.5* 19.6* 24.2* 20.7* SPECIMEN CLOTTED 18.2*  NEUTROABS 11.8*  --   --   --   --  13.9*  --  12.9*  HGB 8.9*   < > 8.8* 8.0* 8.6* 7.7*  --  7.9*  HCT 27.2*   < > 25.2* 22.6* 25.3* 22.9*  --  24.3*  MCV 86.9   < > 84.0 82.5 84.1 84.8  --  88.0  PLT 94*   < > 68* 72* 93* 103*  --  137*   < > = values in this interval not displayed.   Cardiac Enzymes: Recent Labs  Lab 01/14/22 1816  CKTOTAL 55     CBG: Recent Labs  Lab 01/14/22 1846 01/15/22 0807  01/15/22 1249 01/15/22 1814  GLUCAP 96 92 90 92     IMAGING STUDIES ECHOCARDIOGRAM COMPLETE  Result Date: 01/15/2022    ECHOCARDIOGRAM REPORT   Patient  Name:   Maryella P Buttrey Date of Exam: 01/15/2022 Medical Rec #:  [id number]    Height:       66.5 in Accession #:    [id number]   Weight:       135.0 lb Date of Birth:  February 09, 1955   BSA:          1.702 m Patient Age:    33 years     BP:           110/40 mmHg Patient Gender: F            HR:           83 bpm. Exam Location:  Inpatient Procedure: 2D Echo, Color Doppler and Cardiac Doppler Indications:    Elevated troponin  History:        Patient has prior history of Echocardiogram examinations, most                 recent 12/01/2020. COPD; Risk Factors:Current Smoker. Alcoholic                 cirrhosis s/p TIPS, anemia.  Sonographer:    Eartha Inch Referring Phys: [id number] Rhetta Mura  Sonographer Comments: Technically challenging study due to limited acoustic windows. Image acquisition challenging due to patient body habitus, Image acquisition challenging due to uncooperative patient and Image acquisition challenging due to respiratory motion. IMPRESSIONS  1. Left ventricular ejection fraction, by estimation, is 60 to 65%. The left ventricle has normal function. The left ventricle has no regional wall motion abnormalities. Left ventricular diastolic parameters were normal.  2. Right ventricular systolic function is normal. The right ventricular size is normal. There is normal pulmonary artery systolic pressure. The estimated right ventricular systolic pressure is 40.3 mmHg.  3. The mitral valve is normal in structure. Mild mitral valve regurgitation. No evidence of mitral stenosis.  4. The aortic valve is normal in structure. Aortic valve regurgitation is not visualized. No aortic stenosis is present.  5. The inferior vena cava is normal in size with greater than 50% respiratory variability, suggesting right atrial pressure of 3 mmHg. Comparison(s):  Prior images unable to be directly viewed, comparison made by report only. FINDINGS  Left Ventricle: Left ventricular ejection fraction, by estimation, is 60 to 65%. The left ventricle has normal function. The left ventricle has no regional wall motion abnormalities. The left ventricular internal cavity size was normal in size. There is  no left ventricular hypertrophy. Left ventricular diastolic parameters were normal. Right Ventricle: The right ventricular size is normal. No increase in right ventricular wall thickness. Right ventricular systolic function is normal. There is normal pulmonary artery systolic pressure. The tricuspid regurgitant velocity is 2.14 m/s, and  with an assumed right atrial pressure of 3 mmHg, the estimated right ventricular systolic pressure is 47.4 mmHg. Left Atrium: Left atrial size was normal in size. Right Atrium: Right atrial size was normal in size. Pericardium: There is no evidence of pericardial effusion. Mitral Valve: The mitral valve is normal in structure. Mild mitral valve regurgitation, with centrally-directed jet. No evidence of mitral valve stenosis. MV peak gradient, 5.9 mmHg. The mean mitral valve gradient is 3.0 mmHg. Tricuspid Valve: The tricuspid valve is normal in structure. Tricuspid valve regurgitation is trivial. No evidence of tricuspid stenosis. Aortic Valve: The aortic valve is normal in structure. Aortic valve regurgitation is not visualized. No aortic stenosis is present. Pulmonic Valve: The pulmonic valve was normal in structure. Pulmonic valve regurgitation is not visualized.  No evidence of pulmonic stenosis. Aorta: The aortic root is normal in size and structure. Venous: The inferior vena cava is normal in size with greater than 50% respiratory variability, suggesting right atrial pressure of 3 mmHg. IAS/Shunts: No atrial level shunt detected by color flow Doppler.   LV Volumes (MOD) LV vol d, MOD A2C: 74.0 ml Diastology LV vol d, MOD A4C: 86.5 ml LV e'  medial:    9.95 cm/s LV vol s, MOD A2C: 13.8 ml LV E/e' medial:  9.0 LV vol s, MOD A4C: 23.6 ml LV e' lateral:   11.30 cm/s LV SV MOD A2C:     60.2 ml LV E/e' lateral: 8.0 LV SV MOD A4C:     86.5 ml LV SV MOD BP:      62.1 ml RIGHT VENTRICLE RV S prime:     14.00 cm/s TAPSE (M-mode): 1.9 cm LEFT ATRIUM             Index        RIGHT ATRIUM          Index LA Vol (A2C):   54.6 ml 32.09 ml/m  RA Area:     6.50 cm LA Vol (A4C):   39.4 ml 23.15 ml/m  RA Volume:   8.93 ml  5.25 ml/m LA Biplane Vol: 49.0 ml 28.80 ml/m  AORTIC VALVE LVOT Vmax:   134.00 cm/s LVOT Vmean:  90.200 cm/s LVOT VTI:    0.269 m MITRAL VALVE               TRICUSPID VALVE MV Area (PHT): 2.92 cm    TR Peak grad:   18.3 mmHg MV Peak grad:  5.9 mmHg    TR Vmax:        214.00 cm/s MV Mean grad:  3.0 mmHg MV Vmax:       1.21 m/s    SHUNTS MV Vmean:      90.6 cm/s   Systemic VTI: 0.27 m MV Decel Time: 260 msec MR Peak grad: 74.6 mmHg MR Vmax:      432.00 cm/s MV E velocity: 90.00 cm/s MV A velocity: 83.20 cm/s MV E/A ratio:  1.08 Mihai Croitoru MD Electronically signed by Sanda Klein MD Signature Date/Time: 01/15/2022/11:05:44 AM    Final    CT Head Wo Contrast  Result Date: 01/14/2022 CLINICAL DATA:  Altered mental status EXAM: CT HEAD WITHOUT CONTRAST TECHNIQUE: Contiguous axial images were obtained from the base of the skull through the vertex without intravenous contrast. RADIATION DOSE REDUCTION: This exam was performed according to the departmental dose-optimization program which includes automated exposure control, adjustment of the mA and/or kV according to patient size and/or use of iterative reconstruction technique. COMPARISON:  12/26/2021 FINDINGS: Brain: There is no mass, hemorrhage or extra-axial collection. The size and configuration of the ventricles and extra-axial CSF spaces are normal. The brain parenchyma is normal, without acute or chronic infarction. Vascular: Atherosclerotic calcification of the vertebral and internal  carotid arteries at the skull base. No abnormal hyperdensity of the major intracranial arteries or dural venous sinuses. Skull: The visualized skull base, calvarium and extracranial soft tissues are normal. Sinuses/Orbits: No fluid levels or advanced mucosal thickening of the visualized paranasal sinuses. No mastoid or middle ear effusion. The orbits are normal. IMPRESSION: 1. No acute intracranial abnormality. 2. Atherosclerotic calcification of the vertebral and internal carotid arteries at the skull base. Electronically Signed   By: Ulyses Jarred M.D.   On: 01/14/2022 23:18   DG Chest  Portable 1 View  Result Date: 01/14/2022 CLINICAL DATA:  Altered mental status. EXAM: PORTABLE CHEST 1 VIEW COMPARISON:  December 11, 2021 FINDINGS: The heart size and mediastinal contours are within normal limits. Both lungs are clear. A radiopaque stent is seen overlying the medial aspect of the right upper quadrant. Multiple chronic bilateral rib fractures are seen. IMPRESSION: No active cardiopulmonary disease. Electronically Signed   By: Virgina Norfolk M.D.   On: 01/14/2022 19:15    DISCHARGE EXAMINATION: See progress note from earlier today  DISPOSITION: SNF  Discharge Instructions     Call MD for:  difficulty breathing, headache or visual disturbances   Complete by: As directed    Call MD for:  extreme fatigue   Complete by: As directed    Call MD for:  persistant dizziness or light-headedness   Complete by: As directed    Call MD for:  persistant nausea and vomiting   Complete by: As directed    Call MD for:  severe uncontrolled pain   Complete by: As directed    Call MD for:  temperature >100.4   Complete by: As directed    Diet - low sodium heart healthy   Complete by: As directed    Discharge instructions   Complete by: As directed    Please review instructions on the discharge summary.  You were cared for by a hospitalist during your hospital stay. If you have any questions about your  discharge medications or the care you received while you were in the hospital after you are discharged, you can call the unit and asked to speak with the hospitalist on call if the hospitalist that took care of you is not available. Once you are discharged, your primary care physician will handle any further medical issues. Please note that NO REFILLS for any discharge medications will be authorized once you are discharged, as it is imperative that you return to your primary care physician (or establish a relationship with a primary care physician if you do not have one) for your aftercare needs so that they can reassess your need for medications and monitor your lab values. If you do not have a primary care physician, you can call [phone] for a physician referral.   Increase activity slowly   Complete by: As directed          Allergies as of 01/21/2022       Reactions   Morphine And Related Nausea And Vomiting   Vicodin [hydrocodone-acetaminophen] Nausea And Vomiting        Medication List     STOP taking these medications    promethazine 25 MG tablet Commonly known as: PHENERGAN   sucralfate 1 g tablet Commonly known as: Carafate       TAKE these medications    CALCIUM PO Take 1 tablet by mouth daily.   cephALEXin 500 MG capsule Commonly known as: KEFLEX Take 1 capsule (500 mg total) by mouth every 8 (eight) hours for 5 days.   DULoxetine 60 MG capsule Commonly known as: CYMBALTA Take 60 mg by mouth daily.   FLUoxetine 20 MG capsule Commonly known as: PROZAC Take 1 capsule by mouth daily.   folic acid 1 MG tablet Commonly known as: FOLVITE Take 1 tablet (1 mg total) by mouth daily. Start taking on: January 22, 2022   lactulose 10 GM/15ML solution Commonly known as: CHRONULAC Take 45 mLs (30 g total) by mouth 3 (three) times daily.   multivitamin with minerals Tabs  tablet Take 1 tablet by mouth daily. Start taking on: January 22, 2022   ondansetron 4 MG  tablet Commonly known as: Zofran Take 1 tablet (4 mg total) by mouth daily as needed for nausea or vomiting.   oxyCODONE-acetaminophen 10-325 MG tablet Commonly known as: PERCOCET Take 1 tablet by mouth every 6 (six) hours as needed for pain.   pantoprazole 40 MG tablet Commonly known as: PROTONIX TAKE 1 TABLET BY MOUTH TWICE DAILY   rifaximin 550 MG Tabs tablet Commonly known as: XIFAXAN Take 550 mg by mouth 2 (two) times daily.   spironolactone 50 MG tablet Commonly known as: ALDACTONE Take 25 mg by mouth daily.   Super B Complex/Vitamin C Tabs Take 1 tablet by mouth daily.   thiamine 100 MG tablet Commonly known as: Vitamin B-1 Take 1 tablet (100 mg total) by mouth daily. Start taking on: January 22, 2022   Vitamin D (Ergocalciferol) 1.25 MG (50000 UNIT) Caps capsule Commonly known as: DRISDOL Take 1 capsule by mouth once a week.          Follow-up Information     Avva, Ravisankar, MD Follow up.   Specialty: Internal Medicine Contact information: [address] [place] [address] [phone]                 TOTAL DISCHARGE TIME: 74 minutes  [place]  [facility] Pager on [email or web address]  01/21/2022, 10:25 AM

## 2022-01-21 NOTE — Plan of Care (Signed)

## 2022-01-21 NOTE — Progress Notes (Signed)
PROGRESS NOTE    Elizabeth Cox  TDD:[id number] DOB: 05-16-1955 DOA: 01/14/2022 PCP: Prince Solian, MD   Brief Narrative:  Elizabeth Cox is a 67 y.o. female with medical history significant for alcoholic cirrhosis status post TIPS, recurrent hepatic encephalopathy, COPD, current tobacco abuse, anemia of chronic disease, who was admitted to [facility] on 01/14/2022 with acute encephalopathy after presenting from home to [facility] ED for evaluation of altered mental status.    Assessment & Plan:  Acute hepatic encephalopathy Complicated by noncompliance Mentation seems to be close to baseline now.  Continue with lactulose and rifaximin.   UTI may have also contributed and she was started on antibiotics for the same.   CT head did not show any acute findings.   Patient was seen by speech therapy.  On regular diet.  Leukocytosis/possible UTI/Lactic acidosis Initially there was no clear reason for leukocytosis.  However UA noted to be abnormal.  Patient confused.  Unable to tell if she has dysuria.  She was mildly tender in the suprapubic area.  WBC continued to rise.   Patient was started on ceftriaxone.  It does not look like a urine culture has been sent.  WBC remains persistently elevated though she is afebrile.  The reason for this is not entirely clear.  Could be reactive to some extent.  No such leukocytosis noted on previous labs.  Care everywhere also reviewed. WBC noted to be better today at 18.2.  She remains afebrile.  Blood cultures without any growth. Will change her to oral antibiotics today.  Recommend checking CBC in a week or so to ensure the WBC has improved.  AKI and volume depletion/hyponatremia/Hypokalemia/hypomagnesemia Renal function stable.  Monitor urine output.  Avoid nephrotoxic agents. Stable.  Continue to monitor periodically.  Acute on chronic anemia of chronic disease Based on care everywhere her baseline hemoglobin is between 7 and 8. Patient was transfused  1 unit of PRBC on 1/2. Hemoglobin stable for the most part.  No evidence for overt bleeding.  Thrombocytopenia Likely due to liver disease. Avoid heparin products. Counts are stable.  Alcoholic liver cirrhosis INR noted to be elevated.  She is status post TIPS previously.  DVT prophylaxis: SCDs for now Code Status: Full Family Communication: No family at bedside Disposition: SNF for short-term rehab.  Patient is medically stable for discharge.    Consultants:  None  Procedures:  None  Antimicrobials:  None indicated  Subjective: No new complaints offered.   Objective: Vitals:   01/20/22 2336 01/21/22 0444 01/21/22 0448 01/21/22 0722  BP: (!) 115/50 (!) 114/55 129/81 (!) 120/58  Pulse: 83 85 79 87  Resp: '18 16 16 14  '$ Temp: 97.9 F (36.6 C) 98.1 F (36.7 C) 97.6 F (36.4 C) 98.1 F (36.7 C)  TempSrc: Oral Oral Oral Oral  SpO2: 100% 100% 100% 100%  Weight:   51.6 kg   Height:        Examination:  General appearance: Awake alert.  In no distress Resp: Clear to auscultation bilaterally.  Normal effort Cardio: S1-S2 is normal regular.  No S3-S4.  No rubs murmurs or bruit GI: Abdomen is soft.  Mildly tender diffusely without any rebound rigidity or guarding.  No masses organomegaly. Extremities: No edema.    Data Reviewed: I have personally reviewed following labs and imaging studies  CBC: Recent Labs  Lab 01/15/22 1220 01/16/22 0819 01/17/22 0307 01/18/22 0536 01/19/22 0631 01/20/22 0247 01/21/22 0425 01/21/22 0808  WBC 15.1*   < >  23.5* 19.6* 24.2* 20.7* SPECIMEN CLOTTED 18.2*  NEUTROABS 11.8*  --   --   --   --  13.9*  --  12.9*  HGB 8.9*   < > 8.8* 8.0* 8.6* 7.7*  --  7.9*  HCT 27.2*   < > 25.2* 22.6* 25.3* 22.9*  --  24.3*  MCV 86.9   < > 84.0 82.5 84.1 84.8  --  88.0  PLT 94*   < > 68* 72* 93* 103*  --  137*   < > = values in this interval not displayed.    Basic Metabolic Panel: Recent Labs  Lab 01/14/22 1503 01/14/22 1510  01/15/22 1220 01/16/22 0819 01/17/22 0307 01/18/22 0536 01/19/22 0631 01/20/22 0247 01/21/22 0425  NA  --    < > 136   < > 133* 131* 130* 130* 130*  K  --    < > 3.3*   < > 3.2* 3.1* 4.1 4.0 4.4  CL  --    < > 105   < > 102 101 102 102 104  CO2  --    < > 24   < > 22 21* 20* 21* 19*  GLUCOSE  --    < > 110*   < > 95 99 105* 97 79  BUN  --    < > 30*   < > '17 16 11 11 14  '$ CREATININE  --    < > 1.04*   < > 1.03* 1.09* 1.05* 0.99 1.00  CALCIUM  --    < > 7.7*   < > 7.7* 7.6* 8.2* 8.4* 8.4*  MG 1.9  --  1.7  --   --  1.6*  --  2.4  --    < > = values in this interval not displayed.    GFR: Estimated Creatinine Clearance: 46.3 mL/min (by C-G formula based on SCr of 1 mg/dL). Liver Function Tests: Recent Labs  Lab 01/16/22 0819 01/17/22 0307 01/18/22 0536 01/19/22 0631 01/20/22 0247  AST 74* 65* 53* 63* 40  ALT 40 44 40 54* 41  ALKPHOS 123 141* 136* 161* 149*  BILITOT 3.7* 4.3* 3.4* 3.3* 2.7*  PROT 4.6* 4.9* 4.6* 5.3* 4.8*  ALBUMIN 1.6* 1.7* 1.5* 1.6* <1.5*     Recent Labs  Lab 01/14/22 1816 01/17/22 0307  AMMONIA 50* 54*    Coagulation Profile: Recent Labs  Lab 01/14/22 1816 01/15/22 1220  INR 1.9* 1.6*    Cardiac Enzymes: Recent Labs  Lab 01/14/22 1816  CKTOTAL 55     CBG: Recent Labs  Lab 01/14/22 1846 01/15/22 0807 01/15/22 1249 01/15/22 1814  GLUCAP 96 92 90 92     Sepsis Labs: Recent Labs  Lab 01/14/22 1816 01/14/22 2031  PROCALCITON 1.32  --   LATICACIDVEN 2.0* 1.6      Radiology Studies: No results found.  Scheduled Meds:  DULoxetine  60 mg Oral Daily   feeding supplement  1 Container Oral TID BM   folic acid  1 mg Oral Daily   lactulose  30 g Oral TID   multivitamin with minerals  1 tablet Oral Daily   pantoprazole  40 mg Oral BID   rifaximin  550 mg Oral BID   thiamine  100 mg Oral Daily   Continuous Infusions:  cefTRIAXone (ROCEPHIN)  IV 1 g (01/20/22 0935)     LOS: 7 days    Bonnielee Haff,  [facility]  If 7PM-7AM, please contact night-coverage [email or web address]  01/21/2022, 9:23 AM

## 2022-01-21 NOTE — Progress Notes (Signed)
Mobility Specialist Progress Note   01/21/22 1425  Mobility  Activity Moved into chair position in bed (Bed level exercise)  Level of Assistance Standby assist, set-up cues, supervision of patient - no hands on  Assistive Device None  Range of Motion/Exercises Active;All extremities  Activity Response Tolerated well   Patient received in supine and reluctant to participate due to getting discharged. Eventually agreed to bed level exercises with max encouragement. Completed x3 exercises while in supine with minimal participation.Tolerated without complaint or incident. Was left in supine with all needs met, call bell in reach.   [place] Yanis Juma, BS EXP Mobility Specialist Please contact via SecureChat or Rehab office at [phone]

## 2022-01-21 NOTE — TOC Transition Note (Addendum)
Transition of Care [facility]) - CM/SW Discharge Note   Patient Details  Name: Elizabeth Cox MRN: [id number] Date of Birth: 1955/02/21  Transition of Care [facility] CM/SW Contact:  Pollie Friar, RN Phone Number: 01/21/2022, 11:14 AM   Clinical Narrative:    Pt is discharging to [facility] today. She will transport via [facility]. Bedside RN and daughter updated. Dc packet is at the desk.  CM has provided her with resources for inpatient/ outpatient alcohol counseling.  Number for report: [phone]   Final next level of care: Skilled Nursing Facility Barriers to Discharge: No Barriers Identified   Patient Goals and CMS Choice CMS Medicare.gov Compare Post Acute Care list provided to:: Patient Represenative (must comment) Choice offered to / list presented to : Adult Children  Discharge Placement                Patient chooses bed at:  [facility]) Patient to be transferred to facility by: [place] Name of family member notified: Marsha--daughter Patient and family notified of of transfer: 01/21/22  Discharge Plan and Services Additional resources added to the After Visit Summary for   In-house Referral: Clinical Social Work Discharge Planning Services: CM Consult Post Acute Care Choice: [place]                               Social Determinants of Health (SDOH) Interventions SDOH Screenings   Food Insecurity: No Food Insecurity (01/16/2022)  Housing: Low Risk  (01/16/2022)  Transportation Needs: No Transportation Needs (01/16/2022)  Utilities: Not At Risk (01/16/2022)  Tobacco Use: High Risk (01/14/2022)     Readmission Risk Interventions     No data to display

## 2022-01-24 LAB — CULTURE, BLOOD (ROUTINE X 2)
Culture: NO GROWTH
Culture: NO GROWTH
Special Requests: ADEQUATE
Special Requests: ADEQUATE

## 2022-02-08 ENCOUNTER — Emergency Department (HOSPITAL_COMMUNITY): Payer: BLUE CROSS/BLUE SHIELD

## 2022-02-08 ENCOUNTER — Inpatient Hospital Stay (HOSPITAL_COMMUNITY)
Admission: EM | Admit: 2022-02-08 | Discharge: 2022-02-14 | DRG: 296 | Disposition: E | Payer: BLUE CROSS/BLUE SHIELD | Source: Skilled Nursing Facility | Attending: Emergency Medicine | Admitting: Emergency Medicine

## 2022-02-08 DIAGNOSIS — D649 Anemia, unspecified: Secondary | ICD-10-CM

## 2022-02-08 DIAGNOSIS — R579 Shock, unspecified: Secondary | ICD-10-CM

## 2022-02-08 DIAGNOSIS — I469 Cardiac arrest, cause unspecified: Secondary | ICD-10-CM

## 2022-02-08 DIAGNOSIS — J449 Chronic obstructive pulmonary disease, unspecified: Secondary | ICD-10-CM | POA: Diagnosis present

## 2022-02-08 DIAGNOSIS — Z800 Family history of malignant neoplasm of digestive organs: Secondary | ICD-10-CM | POA: Diagnosis not present

## 2022-02-08 DIAGNOSIS — D689 Coagulation defect, unspecified: Secondary | ICD-10-CM | POA: Diagnosis present

## 2022-02-08 DIAGNOSIS — Z8249 Family history of ischemic heart disease and other diseases of the circulatory system: Secondary | ICD-10-CM | POA: Diagnosis not present

## 2022-02-08 DIAGNOSIS — E872 Acidosis, unspecified: Secondary | ICD-10-CM | POA: Diagnosis present

## 2022-02-08 DIAGNOSIS — K7031 Alcoholic cirrhosis of liver with ascites: Secondary | ICD-10-CM | POA: Diagnosis present

## 2022-02-08 DIAGNOSIS — D62 Acute posthemorrhagic anemia: Secondary | ICD-10-CM | POA: Diagnosis present

## 2022-02-08 DIAGNOSIS — Z825 Family history of asthma and other chronic lower respiratory diseases: Secondary | ICD-10-CM

## 2022-02-08 DIAGNOSIS — N179 Acute kidney failure, unspecified: Secondary | ICD-10-CM | POA: Diagnosis present

## 2022-02-08 DIAGNOSIS — I428 Other cardiomyopathies: Secondary | ICD-10-CM | POA: Diagnosis not present

## 2022-02-08 DIAGNOSIS — E43 Unspecified severe protein-calorie malnutrition: Secondary | ICD-10-CM | POA: Diagnosis present

## 2022-02-08 DIAGNOSIS — J9600 Acute respiratory failure, unspecified whether with hypoxia or hypercapnia: Secondary | ICD-10-CM | POA: Diagnosis present

## 2022-02-08 DIAGNOSIS — E162 Hypoglycemia, unspecified: Secondary | ICD-10-CM | POA: Diagnosis present

## 2022-02-08 DIAGNOSIS — K55059 Acute (reversible) ischemia of intestine, part and extent unspecified: Secondary | ICD-10-CM | POA: Diagnosis present

## 2022-02-08 DIAGNOSIS — R739 Hyperglycemia, unspecified: Secondary | ICD-10-CM | POA: Diagnosis present

## 2022-02-08 DIAGNOSIS — D6959 Other secondary thrombocytopenia: Secondary | ICD-10-CM | POA: Diagnosis present

## 2022-02-08 DIAGNOSIS — E875 Hyperkalemia: Secondary | ICD-10-CM | POA: Diagnosis present

## 2022-02-08 DIAGNOSIS — R578 Other shock: Secondary | ICD-10-CM | POA: Diagnosis present

## 2022-02-08 DIAGNOSIS — Z833 Family history of diabetes mellitus: Secondary | ICD-10-CM

## 2022-02-08 DIAGNOSIS — K7682 Hepatic encephalopathy: Secondary | ICD-10-CM | POA: Diagnosis present

## 2022-02-08 DIAGNOSIS — Z803 Family history of malignant neoplasm of breast: Secondary | ICD-10-CM

## 2022-02-08 DIAGNOSIS — D638 Anemia in other chronic diseases classified elsewhere: Secondary | ICD-10-CM | POA: Diagnosis present

## 2022-02-08 DIAGNOSIS — I959 Hypotension, unspecified: Secondary | ICD-10-CM | POA: Diagnosis present

## 2022-02-08 DIAGNOSIS — R7989 Other specified abnormal findings of blood chemistry: Secondary | ICD-10-CM | POA: Diagnosis present

## 2022-02-08 DIAGNOSIS — Z885 Allergy status to narcotic agent status: Secondary | ICD-10-CM

## 2022-02-08 DIAGNOSIS — Z8711 Personal history of peptic ulcer disease: Secondary | ICD-10-CM

## 2022-02-08 DIAGNOSIS — R627 Adult failure to thrive: Secondary | ICD-10-CM | POA: Diagnosis present

## 2022-02-08 DIAGNOSIS — Z66 Do not resuscitate: Secondary | ICD-10-CM | POA: Diagnosis present

## 2022-02-08 DIAGNOSIS — I468 Cardiac arrest due to other underlying condition: Principal | ICD-10-CM | POA: Diagnosis present

## 2022-02-08 LAB — AMMONIA: Ammonia: >600 umol/L — ABNORMAL HIGH (ref 9–35)

## 2022-02-08 LAB — COMPREHENSIVE METABOLIC PANEL
ALT: 75 U/L — ABNORMAL HIGH (ref 0–44)
ALT: 521 U/L — ABNORMAL HIGH (ref 0–44)
AST: 262 U/L — ABNORMAL HIGH (ref 15–41)
AST: 2001 U/L — ABNORMAL HIGH (ref 15–41)
Albumin: <1.5 g/dL — ABNORMAL LOW (ref 3.5–5.0)
Albumin: <1.5 g/dL — ABNORMAL LOW (ref 3.5–5.0)
Alkaline Phosphatase: 94 U/L (ref 38–126)
Alkaline Phosphatase: 133 U/L — ABNORMAL HIGH (ref 38–126)
BUN: 34 mg/dL — ABNORMAL HIGH (ref 8–23)
BUN: 31 mg/dL — ABNORMAL HIGH (ref 8–23)
CO2: <7 mmol/L — ABNORMAL LOW (ref 22–32)
CO2: <7 mmol/L — ABNORMAL LOW (ref 22–32)
Calcium: 9.3 mg/dL (ref 8.9–10.3)
Calcium: 9.8 mg/dL (ref 8.9–10.3)
Chloride: 112 mmol/L — ABNORMAL HIGH (ref 98–111)
Chloride: 100 mmol/L (ref 98–111)
Creatinine, Ser: 1.78 mg/dL — ABNORMAL HIGH (ref 0.44–1.00)
Creatinine, Ser: 2.06 mg/dL — ABNORMAL HIGH (ref 0.44–1.00)
GFR, Estimated: 31 mL/min — ABNORMAL LOW (ref >60)
GFR, Estimated: 26 mL/min — ABNORMAL LOW (ref >60)
Glucose, Bld: 255 mg/dL — ABNORMAL HIGH (ref 70–99)
Glucose, Bld: 586 mg/dL (ref 70–99)
Potassium: 6.1 mmol/L — ABNORMAL HIGH (ref 3.5–5.1)
Potassium: >7.5 mmol/L (ref 3.5–5.1)
Sodium: 135 mmol/L (ref 135–145)
Sodium: 128 mmol/L — ABNORMAL LOW (ref 135–145)
Total Bilirubin: 1.6 mg/dL — ABNORMAL HIGH (ref 0.3–1.2)
Total Bilirubin: 1.1 mg/dL (ref 0.3–1.2)
Total Protein: 3.5 g/dL — ABNORMAL LOW (ref 6.5–8.1)
Total Protein: <3 g/dL — ABNORMAL LOW (ref 6.5–8.1)

## 2022-02-08 LAB — CBC
HCT: 14 % — ABNORMAL LOW (ref 36.0–46.0)
Hemoglobin: 3.4 g/dL — CL (ref 12.0–15.0)
MCH: 29.1 pg (ref 26.0–34.0)
MCHC: 24.3 g/dL — ABNORMAL LOW (ref 30.0–36.0)
MCV: 119.7 fL — ABNORMAL HIGH (ref 80.0–100.0)
Platelets: 63 10*3/uL — ABNORMAL LOW (ref 150–400)
RBC: 1.17 MIL/uL — ABNORMAL LOW (ref 3.87–5.11)
RDW: 20 % — ABNORMAL HIGH (ref 11.5–15.5)
WBC: 11.3 10*3/uL — ABNORMAL HIGH (ref 4.0–10.5)
nRBC: 3.4 % — ABNORMAL HIGH (ref 0.0–0.2)

## 2022-02-08 LAB — TRIGLYCERIDES: Triglycerides: 122 mg/dL (ref <150)

## 2022-02-08 LAB — CBG MONITORING, ED
Glucose-Capillary: 112 mg/dL — ABNORMAL HIGH (ref 70–99)
Glucose-Capillary: 119 mg/dL — ABNORMAL HIGH (ref 70–99)
Glucose-Capillary: 36 mg/dL — CL (ref 70–99)
Glucose-Capillary: 70 mg/dL (ref 70–99)
Glucose-Capillary: 85 mg/dL (ref 70–99)

## 2022-02-08 LAB — ECHOCARDIOGRAM COMPLETE: Area-P 1/2: 3.37 cm2

## 2022-02-08 LAB — LACTIC ACID, PLASMA
Lactic Acid, Venous: >9 mmol/L (ref 0.5–1.9)
Lactic Acid, Venous: >9 mmol/L (ref 0.5–1.9)

## 2022-02-08 LAB — PROCALCITONIN: Procalcitonin: 0.78 ng/mL

## 2022-02-08 LAB — PROTIME-INR
INR: 10 (ref 0.8–1.2)
Prothrombin Time: 79 seconds — ABNORMAL HIGH (ref 11.4–15.2)

## 2022-02-08 LAB — TROPONIN I (HIGH SENSITIVITY)
Troponin I (High Sensitivity): 57 ng/L — ABNORMAL HIGH (ref <18)
Troponin I (High Sensitivity): 1445 ng/L (ref <18)

## 2022-02-08 LAB — HIV ANTIBODY (ROUTINE TESTING W REFLEX): HIV Screen 4th Generation wRfx: NONREACTIVE

## 2022-02-08 LAB — PHOSPHORUS: Phosphorus: 11.3 mg/dL — ABNORMAL HIGH (ref 2.5–4.6)

## 2022-02-08 LAB — MAGNESIUM: Magnesium: 2.8 mg/dL — ABNORMAL HIGH (ref 1.7–2.4)

## 2022-02-08 LAB — ABO/RH: ABO/RH(D): O NEG

## 2022-02-08 LAB — APTT: aPTT: 57 seconds — ABNORMAL HIGH (ref 24–36)

## 2022-02-08 LAB — PREPARE RBC (CROSSMATCH)

## 2022-02-08 MED ORDER — DEXTROSE 50 % IV SOLN
INTRAVENOUS | Status: AC
  Filled 2022-02-08: qty 50

## 2022-02-08 MED ORDER — CALCIUM GLUCONATE 10 % IV SOLN
1.0000 g | Freq: Once | INTRAVENOUS | Status: AC
  Administered 2022-02-08: 1 g via INTRAVENOUS
  Filled 2022-02-08: qty 10

## 2022-02-08 MED ORDER — LACTATED RINGERS IV BOLUS
500.0000 mL | Freq: Once | INTRAVENOUS | Status: AC
  Administered 2022-02-08: 500 mL via INTRAVENOUS

## 2022-02-08 MED ORDER — LACTATED RINGERS IV BOLUS
1000.0000 mL | Freq: Once | INTRAVENOUS | Status: AC
  Administered 2022-02-08: 1000 mL via INTRAVENOUS

## 2022-02-08 MED ORDER — SODIUM CHLORIDE 0.9% IV SOLUTION: Freq: Once | INTRAVENOUS | Status: DC

## 2022-02-08 MED ORDER — RIFAXIMIN 200 MG PO TABS
200.0000 mg | ORAL_TABLET | Freq: Two times a day (BID) | ORAL | Status: DC
  Filled 2022-02-08: qty 1

## 2022-02-08 MED ORDER — INSULIN ASPART 100 UNIT/ML IJ SOLN: 1.0000 [IU] | INTRAMUSCULAR | Status: DC

## 2022-02-08 MED ORDER — FENTANYL CITRATE PF 50 MCG/ML IJ SOSY: 25.0000 ug | PREFILLED_SYRINGE | INTRAMUSCULAR | Status: DC | PRN

## 2022-02-08 MED ORDER — PANTOPRAZOLE 80MG IVPB - SIMPLE MED
80.0000 mg | Freq: Once | INTRAVENOUS | Status: DC
  Filled 2022-02-08: qty 100

## 2022-02-08 MED ORDER — SODIUM BICARBONATE 8.4 % IV SOLN
INTRAVENOUS | Status: DC
  Filled 2022-02-08: qty 1000

## 2022-02-08 MED ORDER — EPINEPHRINE 1 MG/10ML IJ SOSY
PREFILLED_SYRINGE | INTRAMUSCULAR | Status: DC | PRN
  Administered 2022-02-08 (×2): .5 mg via INTRAVENOUS

## 2022-02-08 MED ORDER — VITAMIN K1 10 MG/ML IJ SOLN
2.5000 mg | Freq: Once | INTRAVENOUS | Status: AC
  Administered 2022-02-08: 2.5 mg via INTRAVENOUS
  Filled 2022-02-08: qty 0.25

## 2022-02-08 MED ORDER — PIPERACILLIN-TAZOBACTAM 3.375 G IVPB 30 MIN: 3.3750 g | Freq: Three times a day (TID) | INTRAVENOUS | Status: DC

## 2022-02-08 MED ORDER — DEXTROSE 10 % IV SOLN: INTRAVENOUS | Status: DC

## 2022-02-08 MED ORDER — SODIUM BICARBONATE 4.2 % IV SOLN: 50.0000 meq | Freq: Once | INTRAVENOUS | Status: DC

## 2022-02-08 MED ORDER — EPINEPHRINE HCL 5 MG/250ML IV SOLN IN NS
0.5000 ug/min | INTRAVENOUS | Status: DC
  Administered 2022-02-08: 5 ug/min via INTRAVENOUS

## 2022-02-08 MED ORDER — FENTANYL CITRATE PF 50 MCG/ML IJ SOSY: 50.0000 ug | PREFILLED_SYRINGE | INTRAMUSCULAR | Status: DC | PRN

## 2022-02-08 MED ORDER — LACTULOSE 10 GM/15ML PO SOLN: 30.0000 g | Freq: Three times a day (TID) | ORAL | Status: DC

## 2022-02-08 MED ORDER — PROPOFOL 1000 MG/100ML IV EMUL: 0.0000 ug/kg/min | INTRAVENOUS | Status: DC

## 2022-02-08 MED ORDER — NOREPINEPHRINE 4 MG/250ML-% IV SOLN
5.0000 ug/min | INTRAVENOUS | Status: DC
  Administered 2022-02-08: 60 ug/min via INTRAVENOUS
  Administered 2022-02-08: 20 ug/min via INTRAVENOUS
  Filled 2022-02-08 (×4): qty 250

## 2022-02-08 MED ORDER — EPINEPHRINE HCL 5 MG/250ML IV SOLN IN NS
INTRAVENOUS | Status: AC
  Filled 2022-02-08: qty 250

## 2022-02-08 MED ORDER — PIPERACILLIN-TAZOBACTAM 3.375 G IVPB 30 MIN: 3.3750 g | Freq: Once | INTRAVENOUS | Status: DC

## 2022-02-08 MED ORDER — DEXTROSE 50 % IV SOLN
INTRAVENOUS | Status: DC | PRN
  Administered 2022-02-08: 1 via INTRAVENOUS

## 2022-02-08 MED ORDER — SODIUM BICARBONATE 8.4 % IV SOLN
100.0000 meq | Freq: Once | INTRAVENOUS | Status: AC
  Administered 2022-02-08: 100 meq via INTRAVENOUS

## 2022-02-08 MED ORDER — MIDAZOLAM HCL 2 MG/2ML IJ SOLN: 1.0000 mg | INTRAMUSCULAR | Status: DC | PRN

## 2022-02-08 MED ORDER — VASOPRESSIN 20 UNITS/100 ML INFUSION FOR SHOCK
0.0000 [IU]/min | INTRAVENOUS | Status: DC
  Administered 2022-02-08: 0.03 [IU]/min via INTRAVENOUS

## 2022-02-09 LAB — BPAM RBC
Blood Product Expiration Date: 202402182359
Blood Product Expiration Date: 202402192359
Blood Product Expiration Date: 202402242359
ISSUE DATE / TIME: 202401261241
ISSUE DATE / TIME: 202401261542
ISSUE DATE / TIME: 202401261542
Unit Type and Rh: 5100
Unit Type and Rh: 9500
Unit Type and Rh: 9500

## 2022-02-09 LAB — TYPE AND SCREEN
ABO/RH(D): O NEG
Antibody Screen: NEGATIVE
Unit division: 0
Unit division: 0
Unit division: 0

## 2022-02-09 LAB — PREPARE FRESH FROZEN PLASMA
Unit division: 0
Unit division: 0

## 2022-02-09 LAB — BPAM FFP
Blood Product Expiration Date: 202401262359
Blood Product Expiration Date: 202401292359
Blood Product Expiration Date: 202401292359
ISSUE DATE / TIME: 202401211651
Unit Type and Rh: 600
Unit Type and Rh: 600
Unit Type and Rh: 6200

## 2022-02-09 LAB — BLOOD PRODUCT ORDER (VERBAL) VERIFICATION

## 2022-02-11 LAB — CBG MONITORING, ED: Glucose-Capillary: 42 mg/dL — CL (ref 70–99)

## 2022-02-14 NOTE — ED Provider Notes (Signed)
[facility] Provider Note   CSN: [id number] Arrival date & time: 2022/02/14  1104     History  Chief Complaint  Patient presents with   Post CPR    Elizabeth Cox is a 67 y.o. female.  HPI     67 year old female comes in postcardiac arrest.  Per EMS, patient resides at a nursing home.  She came out of the shower, collapsed and CPR was initiated on scene by nursing home staff staff. EMS catheter in about 5 to 6 minutes and continued CPR for about 25 more minutes when they had ROSC.  At no point did patient have shockable rhythm.  Family comes to the ER later on.  They indicate that patient has history of alcoholic liver cirrhosis and over the past few months she has had failure to thrive and was discharged to rehab after her last admission to [facility].  Prior to that she was admitted to [facility].  Patient has history of hepatic encephalopathy.  They think that she had a clot at some point in her leg and was started on blood thinners at the outside hospital.  Patient also has known history of peptic ulcer disease and is on Protonix twice daily.  She had TIPS done about a year ago because of ascites.  Home Medications Prior to Admission medications   Not on File      Allergies    Patient has no allergy information on record.    Review of Systems   Review of Systems  Physical Exam Updated Vital Signs BP (!) 55/37   Pulse 67   Temp (!) 92.8 F (33.8 C)   Resp 14   SpO2 99%  Physical Exam Vitals and nursing note reviewed.  Constitutional:      Appearance: She is well-developed.  Cardiovascular:     Rate and Rhythm: Normal rate.  Pulmonary:     Comments: On a ventilator Skin:    General: Skin is warm.  Neurological:     Comments: GCS 3     ED Results / Procedures / Treatments   Labs (all labs ordered are listed, but only abnormal results are displayed) Labs Reviewed  COMPREHENSIVE METABOLIC PANEL -  Abnormal; Notable for the following components:      Result Value   Potassium 6.1 (*)    Chloride 112 (*)    CO2 <7 (*)    Glucose, Bld 255 (*)    BUN 34 (*)    Creatinine, Ser 1.78 (*)    Total Protein 3.5 (*)    Albumin <1.5 (*)    AST 262 (*)    ALT 75 (*)    Total Bilirubin 1.6 (*)    GFR, Estimated 31 (*)    All other components within normal limits  LACTIC ACID, PLASMA - Abnormal; Notable for the following components:   Lactic Acid, Venous >9.0 (*)    All other components within normal limits  CBC - Abnormal; Notable for the following components:   WBC 11.3 (*)    RBC 1.17 (*)    Hemoglobin 3.4 (*)    HCT 14.0 (*)    MCV 119.7 (*)    MCHC 24.3 (*)    RDW 20.0 (*)    Platelets 63 (*)    nRBC 3.4 (*)    All other components within normal limits  APTT - Abnormal; Notable for the following components:   aPTT 57 (*)    All  other components within normal limits  PROTIME-INR - Abnormal; Notable for the following components:   Prothrombin Time 79.0 (*)    INR 10.0 (*)    All other components within normal limits  MAGNESIUM - Abnormal; Notable for the following components:   Magnesium 2.8 (*)    All other components within normal limits  PHOSPHORUS - Abnormal; Notable for the following components:   Phosphorus 11.3 (*)    All other components within normal limits  CBG MONITORING, ED - Abnormal; Notable for the following components:   Glucose-Capillary 112 (*)    All other components within normal limits  CBG MONITORING, ED - Abnormal; Notable for the following components:   Glucose-Capillary 36 (*)    All other components within normal limits  CBG MONITORING, ED - Abnormal; Notable for the following components:   Glucose-Capillary 119 (*)    All other components within normal limits  TROPONIN I (HIGH SENSITIVITY) - Abnormal; Notable for the following components:   Troponin I (High Sensitivity) 57 (*)    All other components within normal limits  CULTURE, BLOOD  (ROUTINE X 2)  CULTURE, BLOOD (ROUTINE X 2)  CULTURE, RESPIRATORY W GRAM STAIN  PROCALCITONIN  TRIGLYCERIDES  BLOOD GAS, ARTERIAL  LACTIC ACID, PLASMA  LACTIC ACID, PLASMA  LACTIC ACID, PLASMA  RAPID URINE DRUG SCREEN, HOSP PERFORMED  URINALYSIS, W/ REFLEX TO CULTURE (INFECTION SUSPECTED)  AMMONIA  I-STAT VENOUS BLOOD GAS, ED  CBG MONITORING, ED  TYPE AND SCREEN  PREPARE RBC (CROSSMATCH)  ABO/RH  TROPONIN I (HIGH SENSITIVITY)    EKG EKG Interpretation  Date/Time:  03-08-2022 11:07:17 EST Ventricular Rate:  70 PR Interval:  279 QRS Duration: 113 QT Interval:  421 QTC Calculation: 455 R Axis:   95 Text Interpretation: Sinus rhythm Prolonged PR interval Probable lateral infarct, age indeterminate No acute changes No old tracing to compare Confirmed by Varney Biles ([id number]) on Mar 08, 2022 12:41:06 PM  Radiology DG Chest Port 1 View  Result Date: 03/08/22 CLINICAL DATA:  ETT placement EXAM: PORTABLE CHEST 1 VIEW COMPARISON:  None Available. FINDINGS: ETT tip is proximally 5 cm from the carina. NG/OG tube tip and side port are in the stomach. Right IJ line with tip overlying the expected area of the mid SVC. Cardiac and mediastinal contours are within normal limits. Mild heterogeneous opacities, likely due to pulmonary edema. No evidence of pleural effusion or pneumothorax. IMPRESSION: 1. ETT tip is proximally 5 cm from the carina. 2. Mild heterogeneous opacities, likely due to pulmonary edema. Electronically Signed   By: Yetta Glassman M.D.   On: 2022/03/08 12:41    Procedures .Central Line  Date/Time: March 08, 2022 2:49 PM  Performed by: Varney Biles, MD Authorized by: Varney Biles, MD   Consent:    Consent obtained:  Emergent situation Universal protocol:    Immediately prior to procedure, a time out was called: yes     Patient identity confirmed:  Arm band Pre-procedure details:    Indication(s): central venous access and insufficient peripheral  access     Hand hygiene: Hand hygiene performed prior to insertion     Sterile barrier technique: All elements of maximal sterile technique followed     Skin preparation:  Chlorhexidine   Skin preparation agent: Skin preparation agent completely dried prior to procedure   Procedure details:    Location:  R internal jugular   Patient position:  Trendelenburg   Procedural supplies:  Triple lumen   Ultrasound guidance: yes  Ultrasound guidance timing: real time     Sterile ultrasound techniques: Sterile gel and sterile probe covers were used     Number of attempts:  1   Successful placement: yes   Post-procedure details:    Post-procedure:  Dressing applied   Assessment:  Blood return through all ports and no pneumothorax on x-ray   Procedure completion:  Tolerated well, no immediate complications   Complications:  + hematoma Comments:     Pt's blood noted to be thin She developed a hematoma with numbing medicine alone .Critical Care  Performed by: Derwood Kaplan, MD Authorized by: Derwood Kaplan, MD   Critical care provider statement:    Critical care time (minutes):  88   Critical care was necessary to treat or prevent imminent or life-threatening deterioration of the following conditions:  Circulatory failure, hepatic failure, shock and respiratory failure   Critical care was time spent personally by me on the following activities:  Development of treatment plan with patient or surrogate, discussions with consultants, evaluation of patient's response to treatment, examination of patient, ordering and review of laboratory studies, ordering and review of radiographic studies, ordering and performing treatments and interventions, pulse oximetry, re-evaluation of patient's condition, review of old charts, obtaining history from patient or surrogate and ventilator management     Medications Ordered in ED Medications  dextrose 50 % solution ( Intravenous Given 02-18-22 1205)   EPINEPHrine (ADRENALIN) 1 MG/10ML injection (0.5 mg Intravenous Given 02-18-22 1123)  norepinephrine (LEVOPHED) 4mg  in (0.016 mg/mL) premix infusion (60 mcg/min Intravenous Rate/Dose Change 02-18-2022 1433)  fentaNYL (SUBLIMAZE) injection 25 mcg (has no administration in time range)  fentaNYL (SUBLIMAZE) injection 25-100 mcg (has no administration in time range)  midazolam (VERSED) injection 1-2 mg (has no administration in time range)  EPINEPHrine NaCl 5-0.9 MG/250ML-% premix infusion (has no administration in time range)  EPINEPHrine (ADRENALIN) 5 mg in NS 250 mL (0.02 mg/mL) premix infusion (25 mcg/min Intravenous Infusion Verify 02/18/22 1449)  dextrose 10 % infusion ( Intravenous New Bag/Given 02-18-22 1205)  0.9 %  sodium chloride infusion (Manually program via Guardrails IV Fluids) (has no administration in time range)  0.9 %  sodium chloride infusion (Manually program via Guardrails IV Fluids) (has no administration in time range)  pantoprazole (PROTONIX) 80 mg /NS 100 mL IVPB (has no administration in time range)  vasopressin (PITRESSIN) 20 Units in sodium chloride 0.9 % 100 mL infusion-*FOR SHOCK* (0.03 Units/min Intravenous New Bag/Given 02-18-2022 1436)  lactated ringers bolus 1,000 mL (1,000 mLs Intravenous New Bag/Given 02-18-22 1125)  lactated ringers bolus 500 mL (500 mLs Intravenous New Bag/Given 2022-02-18 1222)  phytonadione (VITAMIN K) 2.5 mg in dextrose 5 % 50 mL IVPB (2.5 mg Intravenous New Bag/Given Feb 18, 2022 1354)  sodium bicarbonate injection 100 mEq (100 mEq Intravenous Given 02/18/22 1420)    ED Course/ Medical Decision Making/ A&P                             Medical Decision Making 67 year old patient comes in after suffering a witnessed cardiac arrest.  It appears that she has liver disease history.  She does not have any known cardiac or primary lung disease history.  Patient appears cachectic, pale.  Pupils are unresponsive.  She had a 6 oh ET tube in place, there is  no intrinsic respiratory drive.  We placed a larger ET tube.  Patient arrived hypotensive with a pulse.  No CPR needed in the  emergency room.  Pressors initiated through the IO line initially, central line placed.  I reviewed care everywhere after patient's information was provided by family.  She indeed was told that she has an ulcer by GI doctors recently, and her antacids were doubled.  I could not see any discharge summary from [facility], it is still unclear if patient is on any anticoagulation.  Labs over here indicate profound acidosis with severe anemia supratherapeutic INR.  Patient has received vitamin K in the grams of the push Protonix.  She is status post TIPS, there is no notification from GI about varices therefore we have not initiated octreotide.  Ordered 1 unit of O- blood along with 3 units of uncrossed blood.  Patient was discharged with a hemoglobin of over 7 earlier this month.  Hemodynamically patient is extremely labile.  She is on norepinephrine and epinephrine.  IV fluids has been ordered.  Patient's blood sugar also has been extremely labile and that she has received multiple amps of D50 and has been started on 10 fluid.  Patient came in as a full code.  She is critically ill, prognosis is poor overall. At length discussion with family about goals of care.  The daughter is to sit with other family members.  Critical care team has seen conversation.  Patient's CODE STATUS at the moment is DNR.  X-ray of the chest independently interpreted, it is negative for pneumothorax.  ET tube about 4 cm above the carina.  Amount and/or Complexity of Data Reviewed Labs: ordered. Radiology: ordered.  Risk Prescription drug management. Decision regarding hospitalization.    Final Clinical Impression(s) / ED Diagnoses Final diagnoses:  Cardiac arrest ([place])  Shock ([place])  Anemia, unspecified type    Rx / DC Orders ED Discharge Orders     None          Varney Biles, MD 03/05/22 1457

## 2022-02-14 NOTE — Progress Notes (Signed)
Pt and pt's family currently in meeting about pt care.

## 2022-02-14 NOTE — ED Notes (Signed)
Critical Care NP called Time of death at 55.

## 2022-02-14 NOTE — IPAL (Signed)
  Interdisciplinary Goals of Care Family Meeting   Date carried out:: 03/05/2022  Location of the meeting: Bedside  Member's involved: Nurse Practitioner, Bedside Registered Nurse, and Family Member or next of kin  Durable Power of Attorney or acting medical decision maker:   Son Elizabeth Cox,  daughter Elizabeth Cox and mother at bedside.   Discussion: We discussed goals of care for Elizabeth Cox .   We discussed baseline advanced ETOH Cirrhosis w/ prior TIPS. Recent hospitalization for decompensated hepatic encephalopathy. Since discharge has had progressive decline with family noting over the last 2 weeks, decreased oral intake, increased fatigue and lethargy, and more confusion.  She is now s/p cardiac arrest w/ estimated 30 minutes of downtime Post arrest exam  GCS 3 Multifactorial shock (on three pressors) 2/2 hemorrhagic shock and post-arrest vasoplegia She is malnourished has severe metabolic disarray. The family voices how important it is that she "not suffer". They all acknowledge she has been failing for some time now. I explained to them based on her current findings superimposed on hx there is really no other interventions except supportive at this time.   Code status: Full DNR  Disposition: Continue current acute care; BUT no new pressors, no further titration. We will correct acidosis as able but she is NOT a dialysis candidate.  If her CT brain shows sig injury we will transition to comfort.  Family is aware that she is unlikely to survive.    Time spent for the meeting: 30 minutes   Clementeen Graham March 05, 2022, 2:45 PM

## 2022-02-14 NOTE — Progress Notes (Signed)
Pharmacy Antibiotic Note  Elizabeth Cox is a 67 y.o. female admitted on 03-01-22 s/p witnessed cardiac arrest with CPR ~30 minutes by SNF staff and EMS prior to [place]. Pt intubated in field and concerned for aspiration pneumonia.  Pharmacy has been consulted for Zosyn (piperacillin-tazobactam) and Vancomyin dosing.  WBC 11.3, hypothermic with temperature of 93.3 F LA > 9 s/p cardiac arrest, PCT 0.78 SCr 1.78 (baseline SCr unknown)  Plan: Initiate Zosyn 3.375g IV over 53min x1, followed by Zosyn 3.375g IV q8h extended infusion  Initiate loading dose of Vancomycin not ordered prior to patient expiring Monitor daily CBC, temp, SCr, and for clinical signs of improvement  F/u cultures and de-escalate antibiotics as able     Temp (24hrs), Avg:92.5 F (33.6 C), Min:91.8 F (33.2 C), Max:93.7 F (34.3 C)  Recent Labs  Lab 03/01/22 1200  WBC 11.3*  CREATININE 1.78*  LATICACIDVEN >9.0*    CrCl cannot be calculated (Unknown ideal weight.).    No Known Allergies  Antimicrobials this admission: Zosyn Mar 01, 2022 >>  Vancomycin 2022-03-01 >>   Dose adjustments this admission: N/A  Microbiology results: 2022-03-01 BCx: pending 03-01-22 Sputum:  pending March 01, 2022 MRSA PCR: ordered  Thank you for allowing pharmacy to be a part of this patient's care.  Luisa Hart, PharmD, BCPS Clinical Pharmacist 01-Mar-2022 4:16 PM   Please refer to [facility] for pharmacy phone number

## 2022-02-14 NOTE — H&P (Signed)
NAME:  Elizabeth Cox, MRN:  [id number], DOB:  12/11/1955, LOS: 0 ADMISSION DATE:  20-Feb-2022, CONSULTATION DATE:  2022-02-20 REFERRING MD:  Rhunette Croft, MD , CHIEF COMPLAINT:  Cardiac Arrest   History of Present Illness:  Elizabeth Cox is a 67 year old pt with a significant PMH of female with medical history significant for alcoholic cirrhosis status post TIPS, recurrent hepatic encephalopathy, COPD, current tobacco abuse, anemia of chronic disease, who resides at [facility]. Presents to [facility] ER 2022-02-20 after witnessed cardiac arrest after returning from shower. Per report received ~ 6 minutes CPR by SNF staff and then another 24 minutes by EMS prior to ROSC. Never had shockable rhythm. Intubated in field w/ tube exchanged by ED team on arrival from 6 to 7.5.   Family reports that she was recently discharged two weeks ago and they do not think she has been progressing well since. Family states that they believe she had a blood clot ands was taking anticoagulation. Of note, it does appear that she has had an SMV thrombus and was Eliquis, but does not look like this was continued when discharged on 01/21/2022. Further notes appears that she was taken off the Eliquis after having recurrent GI bleeds. Spoke with family at bedside about the severity of her illness. They are very understanding and state that they do not want her to suffer. See IPAL note on 02/20/22.   Initial ER findings: K 6.1, + anion gap metabolic acidosis, scr 1.78, LA >9, WBC 11.3, hgb 3.4, PLTs 63, INR 10, CBG 36. UA and blood Cx pending. PCXR faint R>L airspace disease. CT Head with NAICA. GCS 3, Requiring 3 vasopressors to maintain MAP, on [facility]  PCCM consulted for further management post cardiac arrest on mechanical ventilation.   Pertinent  Medical History  alcoholic cirrhosis status post TIPS, recurrent hepatic encephalopathy, COPD, current tobacco abuse, anemia of chronic disease, SMV thrombus, GI bleed   Significant Hospital Events: Including  procedures, antibiotic start and stop dates in addition to other pertinent events   02-20-2022 - Admitted post CPR, Intubated, on 3 vasopressors, 2u RBC - Hemo 3.4  Interim History / Subjective:  Pt is intubated in critical condition. On my arrival, family is at bedside and was updated. Family provided history as above, and is aware she may die in the hospital. They are understanding and states that she would not want to suffer. Family is agreeable to continue current measures, but will make her a DNR.   Objective   Blood pressure (!) 55/43, pulse 75, temperature (!) 93.3 F (34.1 C), resp. rate (!) 21, SpO2 99 %.    Vent Mode: PRVC FiO2 (%):  [100 %] 100 % Set Rate:  [16 bmp] 16 bmp Vt Set:  [400 mL] 400 mL PEEP:  [5 cmH20] 5 cmH20   Intake/Output Summary (Last 24 hours) at February 20, 2022 1556 Last data filed at 20-Feb-2022 1449 Gross per 24 hour  Intake 109.34 ml  Output --  Net 109.34 ml   There were no vitals filed for this visit.  Physical Examination: General: Chronically ill-appearing elderly female in NAD. Intubated HEENT: [place]/AT, icteric sclera, Sluggish but reactive, moist mucous membranes. Neuro: Comatose. Does not respond to verbal, tactile or noxious stimuli. Not following commands.  CV: Irregular periods of NSR/Junctional Bradycardia, no m/g/r. PULM: Breathing even and unlabored on PRVC MV. Lung fields Coarse. GI: Soft, nontender, nondistended. Normoactive bowel sounds. Extremities: trace LE edema noted. Skin: Warm/dry, no rashes, jaundice.  Resolved Hospital Problem list  Assessment & Plan:  Hemorraghic Shock complicated by Post Cardiac Arrest Vasoplegia  Hx of Anemia of Chronic Disease  > Acute blood loss, Hgb 3.4, likely due to GI bleed with history of and recent use of anticoagulation. CPR for approx . Plan - Admit to ICU - On NE (60), Epi (25), Vaso (0.03) titrated to goal MAP - Goal MAP > 65 - Continue Blood Transfusion (2/5) - Will order 3u FFP -  Trend H&H - Monitor for signs of active bleeding - Transfuse for Hgb < 7.0 or hemodynamically unstable/significant bleeding - Trend WBC, fever curve - F/u Cx data - Starting on Zosyn and Vancomycin   Acute Respiratory Failure ?Aspiration PNA  > in the setting of cardiac arrest, requiring intubation Plan - Mechanical Ventilation  - Daily SAT & SBT once stable - VAP prevention protocol - PAD Protocol - RASS Goal 0 to -1 - Resp Cx pending - ABG   Acute Metabolic Acidosis Lactic Acidosis Hyperkalemia Hypermagnesemia Plan  - Trend BMP - Replete electrolytes as indicated - Chemically corrected K+ in ER, will repeat BMP - Continue IVF - Start Bicarb gtt  Acute Metabolic/Hepatic Encephalopathy Alcoholic Liver Cirrhosis  Thrombocytopenia Coagulapathy  > S/p post TIPS, complicated by noncompliance > Concerned that she may have an anoxic brain injury given prolonged downtime Plan  - CT Head NAICA - Continue Blood Transfusion (2/5) - Will order 3u FFP - Cont Lactulose - Cont rifaximin  - Supportive Care - Neuroprotective measures: HOB > 30 degrees, normoglycemia, normothermia, electrolytes WNL   AKI Plan - Trend BMP - Replete electrolytes as indicated - Monitor I&Os - F/u urine studies - Avoid nephrotoxic agents as able - Ensure adequate renal perfusion  Hypoglycemia Plan - Cont D10 gtt, de-escalate as appropriate   Severe protein calorie  malnutrition Plan - NPO for now - Anticipate need for RD   Best Practice (right click and "Reselect all SmartList Selections" daily)   Diet/type: NPO DVT prophylaxis: SCD GI prophylaxis: PPI Lines: Central line Foley:  Yes, and it is still needed Code Status:  DNR Last date of multidisciplinary goals of care discussion [03-10-2022 - See IPAL note]  Labs   CBC: Recent Labs  Lab 2022-03-10 1200  WBC 11.3*  HGB 3.4*  HCT 14.0*  MCV 119.7*  PLT 63*    Basic Metabolic Panel: Recent Labs  Lab Mar 10, 2022 1200  NA 135   K 6.1*  CL 112*  CO2 <7*  GLUCOSE 255*  BUN 34*  CREATININE 1.78*  CALCIUM 9.3  MG 2.8*  PHOS 11.3*   GFR: CrCl cannot be calculated (Unknown ideal weight.). Recent Labs  Lab 2022/03/10 1200  PROCALCITON 0.78  WBC 11.3*  LATICACIDVEN >9.0*    Liver Function Tests: Recent Labs  Lab 03-10-22 1200  AST 262*  ALT 75*  ALKPHOS 94  BILITOT 1.6*  PROT 3.5*  ALBUMIN <1.5*   No results for input(s): "LIPASE", "AMYLASE" in the last 168 hours. No results for input(s): "AMMONIA" in the last 168 hours.  ABG No results found for: "PHART", "PCO2ART", "PO2ART", "HCO3", "TCO2", "ACIDBASEDEF", "O2SAT"   Coagulation Profile: Recent Labs  Lab 2022-03-10 1200  INR 10.0*    Cardiac Enzymes: No results for input(s): "CKTOTAL", "CKMB", "CKMBINDEX", "TROPONINI" in the last 168 hours.  HbA1C: No results found for: "HGBA1C"  CBG: Recent Labs  Lab 2022/03/10 1116 03/10/2022 1156 03-10-22 1217 10-Mar-2022 1335  GLUCAP 112* 36* 119* 70    Review of Systems:   Review of Systems  Unable  to perform ROS: Critical illness   Past Medical History:  alcoholic cirrhosis status post TIPS, recurrent hepatic encephalopathy, COPD, current tobacco abuse, anemia of chronic disease, SMV thrombus, GI bleed   Surgical History:    CHOLECYSTECTOMY       COLONOSCOPY   2015   ESOPHAGOGASTRODUODENOSCOPY   2015   TONSILLECTOMY      Social History:  Living in SNF  Family History:    Colon cancer Mother 13   Diabetes Mother     Heart disease Mother     Emphysema Mother          smoker   Breast cancer Cousin     Cancer Father          unsure type, father's hx unknown to patient   Allergies  Morphine And Related Nausea And Vomiting   Vicodin [Hydrocodone-Acetaminophen] Nausea And Vomiting     Home Medications  Prior to Admission medications   Not on File     Critical care time:     Royston Bake, NP-S Cct deferred to Attending

## 2022-02-14 NOTE — Code Documentation (Signed)
Reintubated at this time by EDPA.

## 2022-02-14 NOTE — Progress Notes (Signed)
  Echocardiogram 2D Echocardiogram has been performed.  Wynelle Link Mar 02, 2022, 2:50 PM

## 2022-02-14 NOTE — ED Triage Notes (Signed)
Pt bib EMS after witnessed arrest at facility. Pt had just gotten back from her shower and collapsed. Staff initiated CPR and gave approx 6 minutes of CPR before ems arrival. EMS gave CPR from 1009-1033, never a shockable rhythm. Intubated with a 6.0.

## 2022-02-14 NOTE — ED Provider Notes (Signed)
  Procedures Procedure Name: Intubation Date/Time: 02-13-2022 11:24 AM  Performed by: Dorothyann Peng, PA-CPre-anesthesia Checklist: Patient identified, Emergency Drugs available, Suction available, Patient being monitored and Timeout performed Oxygen Delivery Method: Ambu bag Preoxygenation: Pre-oxygenation with 100% oxygen Laryngoscope Size: Glidescope Grade View: Grade I Tube size: 7.5 mm Number of attempts: 1 Airway Equipment and Method: Stylet Placement Confirmation: ETT inserted through vocal cords under direct vision, Positive ETCO2 and Breath sounds checked- equal and bilateral Secured at: 22 cm Tube secured with: ETT holder Dental Injury: Teeth and Oropharynx as per pre-operative assessment         Ronny Bacon Feb 13, 2022 [place], Ankit, MD 02-13-2022 1429

## 2022-02-14 NOTE — Progress Notes (Signed)
Pt transported to CT via vent with no complications noted. Pt returned to room ED26.

## 2022-02-14 NOTE — Progress Notes (Signed)
Spiritual Care Note  Attempted follow-up after Elizabeth Cox's death, but no family was present at the time.  Please page if additional needs arise. Thank you!  [name], [place], [place]

## 2022-02-14 DEATH — deceased
